# Patient Record
Sex: Male | Born: 1935
Health system: Southern US, Community
[De-identification: ages and names within clinical notes are randomized; demographics above are authoritative.]

## PROBLEM LIST (undated history)

## (undated) DIAGNOSIS — E7800 Pure hypercholesterolemia, unspecified: Secondary | ICD-10-CM

## (undated) DIAGNOSIS — I4892 Unspecified atrial flutter: Secondary | ICD-10-CM

## (undated) DIAGNOSIS — C61 Malignant neoplasm of prostate: Secondary | ICD-10-CM

## (undated) DIAGNOSIS — S065X9A Traumatic subdural hemorrhage with loss of consciousness of unspecified duration, initial encounter: Secondary | ICD-10-CM

## (undated) DIAGNOSIS — R55 Syncope and collapse: Secondary | ICD-10-CM

## (undated) DIAGNOSIS — E119 Type 2 diabetes mellitus without complications: Secondary | ICD-10-CM

## (undated) DIAGNOSIS — N529 Male erectile dysfunction, unspecified: Secondary | ICD-10-CM

## (undated) DIAGNOSIS — N400 Enlarged prostate without lower urinary tract symptoms: Secondary | ICD-10-CM

## (undated) DIAGNOSIS — M5137 Other intervertebral disc degeneration, lumbosacral region: Secondary | ICD-10-CM

## (undated) DIAGNOSIS — M519 Unspecified thoracic, thoracolumbar and lumbosacral intervertebral disc disorder: Secondary | ICD-10-CM

## (undated) DIAGNOSIS — M1710 Unilateral primary osteoarthritis, unspecified knee: Secondary | ICD-10-CM

## (undated) DIAGNOSIS — I442 Atrioventricular block, complete: Secondary | ICD-10-CM

## (undated) DIAGNOSIS — M545 Low back pain, unspecified: Secondary | ICD-10-CM

## (undated) DIAGNOSIS — M179 Osteoarthritis of knee, unspecified: Secondary | ICD-10-CM

## (undated) DIAGNOSIS — E785 Hyperlipidemia, unspecified: Secondary | ICD-10-CM

## (undated) DIAGNOSIS — M1990 Unspecified osteoarthritis, unspecified site: Secondary | ICD-10-CM

## (undated) DIAGNOSIS — K625 Hemorrhage of anus and rectum: Secondary | ICD-10-CM

## (undated) DIAGNOSIS — M109 Gout, unspecified: Secondary | ICD-10-CM

## (undated) DIAGNOSIS — I2510 Atherosclerotic heart disease of native coronary artery without angina pectoris: Secondary | ICD-10-CM

## (undated) DIAGNOSIS — R001 Bradycardia, unspecified: Secondary | ICD-10-CM

## (undated) DIAGNOSIS — Z950 Presence of cardiac pacemaker: Secondary | ICD-10-CM

## (undated) DIAGNOSIS — I10 Essential (primary) hypertension: Secondary | ICD-10-CM

## (undated) DIAGNOSIS — Z87898 Personal history of other specified conditions: Secondary | ICD-10-CM

## (undated) DIAGNOSIS — K279 Peptic ulcer, site unspecified, unspecified as acute or chronic, without hemorrhage or perforation: Secondary | ICD-10-CM

## (undated) HISTORY — DX: Type 2 diabetes mellitus without complications: E11.9

## (undated) HISTORY — DX: Atrioventricular block, complete: I44.2

## (undated) HISTORY — DX: Unspecified atrial flutter: I48.92

## (undated) HISTORY — DX: Osteoarthritis of knee, unspecified: M17.9

## (undated) HISTORY — DX: Hemorrhage of anus and rectum: K62.5

## (undated) HISTORY — DX: Essential (primary) hypertension: I10

## (undated) HISTORY — DX: Low back pain: M54.5

## (undated) HISTORY — PX: INSERT / REPLACE / REMOVE PACEMAKER: SUR710

## (undated) HISTORY — DX: Malignant neoplasm of prostate: C61

## (undated) HISTORY — DX: Syncope and collapse: R55

## (undated) HISTORY — DX: Unspecified thoracic, thoracolumbar and lumbosacral intervertebral disc disorder: M51.9

## (undated) HISTORY — PX: HEMORRHOID SURGERY: SHX153

## (undated) HISTORY — PX: TRANSURETHRAL RESECTION OF PROSTATE: SHX73

## (undated) HISTORY — DX: Unspecified osteoarthritis, unspecified site: M19.90

## (undated) HISTORY — DX: Other intervertebral disc degeneration, lumbosacral region: M51.37

## (undated) HISTORY — DX: Traumatic subdural hemorrhage with loss of consciousness of unspecified duration, initial encounter: S06.5X9A

## (undated) HISTORY — DX: Hyperlipidemia, unspecified: E78.5

## (undated) HISTORY — DX: Personal history of other specified conditions: Z87.898

## (undated) HISTORY — DX: Peptic ulcer, site unspecified, unspecified as acute or chronic, without hemorrhage or perforation: K27.9

## (undated) HISTORY — DX: Unilateral primary osteoarthritis, unspecified knee: M17.10

## (undated) HISTORY — DX: Bradycardia, unspecified: R00.1

## (undated) HISTORY — DX: Gout, unspecified: M10.9

## (undated) HISTORY — DX: Male erectile dysfunction, unspecified: N52.9

## (undated) HISTORY — DX: Benign prostatic hyperplasia without lower urinary tract symptoms: N40.0

## (undated) HISTORY — DX: Pure hypercholesterolemia, unspecified: E78.00

## (undated) HISTORY — DX: Atherosclerotic heart disease of native coronary artery without angina pectoris: I25.10

## (undated) HISTORY — DX: Low back pain, unspecified: M54.50

---

## 1998-03-21 ENCOUNTER — Encounter: Payer: Self-pay | Admitting: Internal Medicine

## 1998-03-21 ENCOUNTER — Emergency Department (HOSPITAL_COMMUNITY): Admission: EM | Admit: 1998-03-21 | Discharge: 1998-03-21 | Payer: Self-pay | Admitting: Internal Medicine

## 2002-03-16 ENCOUNTER — Encounter: Payer: Self-pay | Admitting: Emergency Medicine

## 2002-03-16 ENCOUNTER — Emergency Department (HOSPITAL_COMMUNITY): Admission: EM | Admit: 2002-03-16 | Discharge: 2002-03-16 | Payer: Self-pay | Admitting: Emergency Medicine

## 2002-03-23 ENCOUNTER — Emergency Department (HOSPITAL_COMMUNITY): Admission: EM | Admit: 2002-03-23 | Discharge: 2002-03-23 | Payer: Self-pay | Admitting: Emergency Medicine

## 2007-05-26 ENCOUNTER — Encounter: Admission: RE | Admit: 2007-05-26 | Discharge: 2007-05-26 | Payer: Self-pay | Admitting: Obstetrics and Gynecology

## 2007-06-07 ENCOUNTER — Ambulatory Visit: Admission: RE | Admit: 2007-06-07 | Discharge: 2007-06-14 | Payer: Self-pay | Admitting: Radiation Oncology

## 2007-06-15 ENCOUNTER — Ambulatory Visit: Admission: RE | Admit: 2007-06-15 | Discharge: 2007-08-12 | Payer: Self-pay | Admitting: Radiation Oncology

## 2007-08-13 ENCOUNTER — Ambulatory Visit: Admission: RE | Admit: 2007-08-13 | Discharge: 2007-09-18 | Payer: Self-pay | Admitting: Radiation Oncology

## 2010-02-21 ENCOUNTER — Inpatient Hospital Stay (HOSPITAL_COMMUNITY): Admission: EM | Admit: 2010-02-21 | Discharge: 2010-02-23 | Payer: Self-pay | Admitting: Emergency Medicine

## 2010-02-21 ENCOUNTER — Ambulatory Visit: Payer: Self-pay | Admitting: Internal Medicine

## 2010-04-01 DIAGNOSIS — I10 Essential (primary) hypertension: Secondary | ICD-10-CM | POA: Insufficient documentation

## 2010-04-01 DIAGNOSIS — H409 Unspecified glaucoma: Secondary | ICD-10-CM | POA: Insufficient documentation

## 2010-04-01 DIAGNOSIS — Z87898 Personal history of other specified conditions: Secondary | ICD-10-CM

## 2010-04-01 DIAGNOSIS — M1990 Unspecified osteoarthritis, unspecified site: Secondary | ICD-10-CM | POA: Insufficient documentation

## 2010-04-01 DIAGNOSIS — M5137 Other intervertebral disc degeneration, lumbosacral region: Secondary | ICD-10-CM

## 2010-04-01 DIAGNOSIS — M51379 Other intervertebral disc degeneration, lumbosacral region without mention of lumbar back pain or lower extremity pain: Secondary | ICD-10-CM

## 2010-04-01 DIAGNOSIS — E1129 Type 2 diabetes mellitus with other diabetic kidney complication: Secondary | ICD-10-CM | POA: Insufficient documentation

## 2010-04-01 DIAGNOSIS — E785 Hyperlipidemia, unspecified: Secondary | ICD-10-CM | POA: Insufficient documentation

## 2010-04-01 DIAGNOSIS — N400 Enlarged prostate without lower urinary tract symptoms: Secondary | ICD-10-CM

## 2010-04-01 HISTORY — DX: Other intervertebral disc degeneration, lumbosacral region without mention of lumbar back pain or lower extremity pain: M51.379

## 2010-04-01 HISTORY — DX: Other intervertebral disc degeneration, lumbosacral region: M51.37

## 2010-04-01 HISTORY — DX: Personal history of other specified conditions: Z87.898

## 2010-04-01 HISTORY — DX: Unspecified osteoarthritis, unspecified site: M19.90

## 2010-08-27 LAB — CBC
HCT: 30.6 % — ABNORMAL LOW (ref 39.0–52.0)
HCT: 31.9 % — ABNORMAL LOW (ref 39.0–52.0)
Hemoglobin: 10.3 g/dL — ABNORMAL LOW (ref 13.0–17.0)
Hemoglobin: 10.7 g/dL — ABNORMAL LOW (ref 13.0–17.0)
MCH: 28.5 pg (ref 26.0–34.0)
MCHC: 33.5 g/dL (ref 30.0–36.0)
MCHC: 33.7 g/dL (ref 30.0–36.0)
RDW: 12.6 % (ref 11.5–15.5)
RDW: 12.8 % (ref 11.5–15.5)
WBC: 3.9 10*3/uL — ABNORMAL LOW (ref 4.0–10.5)

## 2010-08-27 LAB — CARDIAC PANEL(CRET KIN+CKTOT+MB+TROPI)
CK, MB: 2 ng/mL (ref 0.3–4.0)
CK, MB: 2 ng/mL (ref 0.3–4.0)
Relative Index: 1.3 (ref 0.0–2.5)
Relative Index: 1.3 (ref 0.0–2.5)
Troponin I: <0.01 ng/mL (ref 0.00–0.06)

## 2010-08-27 LAB — BASIC METABOLIC PANEL
Calcium: 8.8 mg/dL (ref 8.4–10.5)
Calcium: 8.9 mg/dL (ref 8.4–10.5)
GFR calc Af Amer: 47 mL/min — ABNORMAL LOW (ref >60)
GFR calc non Af Amer: 38 mL/min — ABNORMAL LOW (ref >60)
GFR calc non Af Amer: 40 mL/min — ABNORMAL LOW (ref >60)
Potassium: 4.5 mEq/L (ref 3.5–5.1)
Potassium: 4.6 mEq/L (ref 3.5–5.1)
Sodium: 138 mEq/L (ref 135–145)
Sodium: 139 mEq/L (ref 135–145)

## 2010-08-27 LAB — COMPREHENSIVE METABOLIC PANEL
ALT: 13 U/L (ref 0–53)
AST: 23 U/L (ref 0–37)
Albumin: 3.7 g/dL (ref 3.5–5.2)
Alkaline Phosphatase: 83 U/L (ref 39–117)
Chloride: 107 mEq/L (ref 96–112)
GFR calc Af Amer: 42 mL/min — ABNORMAL LOW (ref >60)
Potassium: 4.3 mEq/L (ref 3.5–5.1)
Sodium: 138 mEq/L (ref 135–145)
Total Bilirubin: 0.6 mg/dL (ref 0.3–1.2)
Total Protein: 7.1 g/dL (ref 6.0–8.3)

## 2010-08-27 LAB — RETICULOCYTES
RBC.: 3.83 MIL/uL — ABNORMAL LOW (ref 4.22–5.81)
Retic Count, Absolute: 30.6 10*3/uL (ref 19.0–186.0)
Retic Ct Pct: 0.8 % (ref 0.4–3.1)

## 2010-08-27 LAB — URINALYSIS, ROUTINE W REFLEX MICROSCOPIC
Nitrite: NEGATIVE
Specific Gravity, Urine: 1.013 (ref 1.005–1.030)
Urobilinogen, UA: 0.2 mg/dL (ref 0.0–1.0)
pH: 5 (ref 5.0–8.0)

## 2010-08-27 LAB — IRON AND TIBC
Iron: 59 ug/dL (ref 42–135)
Saturation Ratios: 24 % (ref 20–55)
TIBC: 241 ug/dL (ref 215–435)
UIBC: 182 ug/dL

## 2010-08-27 LAB — LIPID PANEL
Cholesterol: 132 mg/dL (ref 0–200)
LDL Cholesterol: 83 mg/dL (ref 0–99)
Total CHOL/HDL Ratio: 3.5 RATIO
Triglycerides: 56 mg/dL (ref <150)
VLDL: 11 mg/dL (ref 0–40)

## 2010-08-27 LAB — DIFFERENTIAL
Basophils Absolute: 0 10*3/uL (ref 0.0–0.1)
Basophils Relative: 1 % (ref 0–1)
Eosinophils Absolute: 0.1 10*3/uL (ref 0.0–0.7)
Monocytes Absolute: 0.3 10*3/uL (ref 0.1–1.0)
Monocytes Relative: 12 % (ref 3–12)
Neutro Abs: 1.5 10*3/uL — ABNORMAL LOW (ref 1.7–7.7)
Neutrophils Relative %: 52 % (ref 43–77)

## 2010-08-27 LAB — GLUCOSE, CAPILLARY
Glucose-Capillary: 107 mg/dL — ABNORMAL HIGH (ref 70–99)
Glucose-Capillary: 178 mg/dL — ABNORMAL HIGH (ref 70–99)
Glucose-Capillary: 196 mg/dL — ABNORMAL HIGH (ref 70–99)
Glucose-Capillary: 54 mg/dL — ABNORMAL LOW (ref 70–99)
Glucose-Capillary: 68 mg/dL — ABNORMAL LOW (ref 70–99)
Glucose-Capillary: 99 mg/dL (ref 70–99)
Glucose-Capillary: 99 mg/dL (ref 70–99)

## 2010-08-27 LAB — POCT I-STAT, CHEM 8
Calcium, Ion: 1.18 mmol/L (ref 1.12–1.32)
Chloride: 110 mEq/L (ref 96–112)
Creatinine, Ser: 1.9 mg/dL — ABNORMAL HIGH (ref 0.4–1.5)
Glucose, Bld: 86 mg/dL (ref 70–99)
Potassium: 4.6 mEq/L (ref 3.5–5.1)

## 2010-08-27 LAB — PROTIME-INR: Prothrombin Time: 14.3 seconds (ref 11.6–15.2)

## 2010-08-27 LAB — POCT CARDIAC MARKERS
CKMB, poc: 1.1 ng/mL (ref 1.0–8.0)
Troponin i, poc: <0.05 ng/mL (ref 0.00–0.09)

## 2010-08-27 LAB — PHOSPHORUS: Phosphorus: 3.3 mg/dL (ref 2.3–4.6)

## 2010-08-27 LAB — FERRITIN: Ferritin: 120 ng/mL (ref 22–322)

## 2010-08-27 LAB — TSH: TSH: 1.723 u[IU]/mL (ref 0.350–4.500)

## 2010-08-27 LAB — MRSA PCR SCREENING: MRSA by PCR: NEGATIVE

## 2010-08-27 LAB — CK TOTAL AND CKMB (NOT AT ARMC): Total CK: 173 U/L (ref 7–232)

## 2010-10-13 HISTORY — PX: PACEMAKER INSERTION: SHX728

## 2010-11-02 ENCOUNTER — Inpatient Hospital Stay (HOSPITAL_COMMUNITY)
Admission: EM | Admit: 2010-11-02 | Discharge: 2010-11-08 | DRG: 243 | Disposition: A | Discharge status: Home / Self Care | Payer: No Typology Code available for payment source | Source: Other Acute Inpatient Hospital | Attending: Cardiology | Admitting: Cardiology

## 2010-11-02 ENCOUNTER — Emergency Department (HOSPITAL_COMMUNITY)
Admission: EM | Admit: 2010-11-02 | Discharge: 2010-11-02 | Disposition: A | Discharge status: Other Acute Inpatient Hospital | Payer: No Typology Code available for payment source | Source: Home / Self Care | Attending: Emergency Medicine | Admitting: Emergency Medicine

## 2010-11-02 DIAGNOSIS — I4892 Unspecified atrial flutter: Secondary | ICD-10-CM | POA: Diagnosis present

## 2010-11-02 DIAGNOSIS — I495 Sick sinus syndrome: Principal | ICD-10-CM | POA: Diagnosis present

## 2010-11-02 DIAGNOSIS — D696 Thrombocytopenia, unspecified: Secondary | ICD-10-CM | POA: Diagnosis present

## 2010-11-02 DIAGNOSIS — M1710 Unilateral primary osteoarthritis, unspecified knee: Secondary | ICD-10-CM | POA: Diagnosis present

## 2010-11-02 DIAGNOSIS — E1169 Type 2 diabetes mellitus with other specified complication: Secondary | ICD-10-CM | POA: Insufficient documentation

## 2010-11-02 DIAGNOSIS — N179 Acute kidney failure, unspecified: Secondary | ICD-10-CM | POA: Diagnosis not present

## 2010-11-02 DIAGNOSIS — R29898 Other symptoms and signs involving the musculoskeletal system: Secondary | ICD-10-CM | POA: Diagnosis not present

## 2010-11-02 DIAGNOSIS — R4182 Altered mental status, unspecified: Secondary | ICD-10-CM | POA: Insufficient documentation

## 2010-11-02 DIAGNOSIS — Z79899 Other long term (current) drug therapy: Secondary | ICD-10-CM

## 2010-11-02 DIAGNOSIS — M5137 Other intervertebral disc degeneration, lumbosacral region: Secondary | ICD-10-CM | POA: Diagnosis present

## 2010-11-02 DIAGNOSIS — I498 Other specified cardiac arrhythmias: Secondary | ICD-10-CM | POA: Insufficient documentation

## 2010-11-02 DIAGNOSIS — M51379 Other intervertebral disc degeneration, lumbosacral region without mention of lumbar back pain or lower extremity pain: Secondary | ICD-10-CM | POA: Diagnosis present

## 2010-11-02 DIAGNOSIS — N400 Enlarged prostate without lower urinary tract symptoms: Secondary | ICD-10-CM | POA: Diagnosis present

## 2010-11-02 DIAGNOSIS — D649 Anemia, unspecified: Secondary | ICD-10-CM | POA: Diagnosis present

## 2010-11-02 DIAGNOSIS — I059 Rheumatic mitral valve disease, unspecified: Secondary | ICD-10-CM | POA: Diagnosis present

## 2010-11-02 DIAGNOSIS — E785 Hyperlipidemia, unspecified: Secondary | ICD-10-CM | POA: Diagnosis present

## 2010-11-02 DIAGNOSIS — I10 Essential (primary) hypertension: Secondary | ICD-10-CM | POA: Insufficient documentation

## 2010-11-02 DIAGNOSIS — R55 Syncope and collapse: Secondary | ICD-10-CM

## 2010-11-02 DIAGNOSIS — Z794 Long term (current) use of insulin: Secondary | ICD-10-CM

## 2010-11-02 LAB — CBC
HCT: 37 % — ABNORMAL LOW (ref 39.0–52.0)
Hemoglobin: 12 g/dL — ABNORMAL LOW (ref 13.0–17.0)
Hemoglobin: 11.5 g/dL — ABNORMAL LOW (ref 13.0–17.0)
MCH: 26.3 pg (ref 26.0–34.0)
MCHC: 32.4 g/dL (ref 30.0–36.0)
MCHC: 33.3 g/dL (ref 30.0–36.0)
RDW: 13.5 % (ref 11.5–15.5)

## 2010-11-02 LAB — DIFFERENTIAL
Basophils Relative: 0 % (ref 0–1)
Eosinophils Relative: 1 % (ref 0–5)
Monocytes Absolute: 0.4 10*3/uL (ref 0.1–1.0)
Monocytes Relative: 9 % (ref 3–12)
Neutro Abs: 3.3 10*3/uL (ref 1.7–7.7)

## 2010-11-02 LAB — URINALYSIS, ROUTINE W REFLEX MICROSCOPIC
Glucose, UA: NEGATIVE mg/dL
Leukocytes, UA: NEGATIVE
pH: 5.5 (ref 5.0–8.0)

## 2010-11-02 LAB — URINE MICROSCOPIC-ADD ON

## 2010-11-02 LAB — COMPREHENSIVE METABOLIC PANEL
Alkaline Phosphatase: 74 U/L (ref 39–117)
BUN: 20 mg/dL (ref 6–23)
CO2: 27 mEq/L (ref 19–32)
Chloride: 109 mEq/L (ref 96–112)
GFR calc non Af Amer: 55 mL/min — ABNORMAL LOW (ref >60)
Glucose, Bld: 91 mg/dL (ref 70–99)
Potassium: 4.3 mEq/L (ref 3.5–5.1)
Total Bilirubin: 0.3 mg/dL (ref 0.3–1.2)
Total Protein: 6.5 g/dL (ref 6.0–8.3)

## 2010-11-02 LAB — GLUCOSE, CAPILLARY
Glucose-Capillary: 116 mg/dL — ABNORMAL HIGH (ref 70–99)
Glucose-Capillary: 59 mg/dL — ABNORMAL LOW (ref 70–99)

## 2010-11-02 LAB — POCT CARDIAC MARKERS

## 2010-11-02 LAB — PROTIME-INR
INR: 1.15 (ref 0.00–1.49)
Prothrombin Time: 14.9 seconds (ref 11.6–15.2)

## 2010-11-02 LAB — CK TOTAL AND CKMB (NOT AT ARMC): Relative Index: 2 (ref 0.0–2.5)

## 2010-11-03 DIAGNOSIS — I495 Sick sinus syndrome: Secondary | ICD-10-CM

## 2010-11-03 DIAGNOSIS — I059 Rheumatic mitral valve disease, unspecified: Secondary | ICD-10-CM

## 2010-11-03 DIAGNOSIS — R55 Syncope and collapse: Secondary | ICD-10-CM

## 2010-11-03 DIAGNOSIS — I498 Other specified cardiac arrhythmias: Secondary | ICD-10-CM

## 2010-11-03 LAB — CBC
HCT: 31.9 % — ABNORMAL LOW (ref 39.0–52.0)
MCH: 26.5 pg (ref 26.0–34.0)
MCHC: 33.5 g/dL (ref 30.0–36.0)
MCV: 79 fL (ref 78.0–100.0)
RDW: 13.5 % (ref 11.5–15.5)

## 2010-11-03 LAB — POCT I-STAT, CHEM 8
Calcium, Ion: 0.97 mmol/L — ABNORMAL LOW (ref 1.12–1.32)
Calcium, Ion: 0.97 mmol/L — ABNORMAL LOW (ref 1.12–1.32)
Creatinine, Ser: 1.6 mg/dL — ABNORMAL HIGH (ref 0.4–1.5)
Creatinine, Ser: 1.7 mg/dL — ABNORMAL HIGH (ref 0.4–1.5)
Glucose, Bld: 94 mg/dL (ref 70–99)
Glucose, Bld: 96 mg/dL (ref 70–99)
HCT: 37 % — ABNORMAL LOW (ref 39.0–52.0)
Hemoglobin: 12.6 g/dL — ABNORMAL LOW (ref 13.0–17.0)
Hemoglobin: 12.9 g/dL — ABNORMAL LOW (ref 13.0–17.0)
Potassium: 8.9 mEq/L (ref 3.5–5.1)
Potassium: 8.9 mEq/L (ref 3.5–5.1)
TCO2: 20 mmol/L (ref 0–100)

## 2010-11-03 LAB — GLUCOSE, CAPILLARY
Glucose-Capillary: 82 mg/dL (ref 70–99)
Glucose-Capillary: 167 mg/dL — ABNORMAL HIGH (ref 70–99)
Glucose-Capillary: 115 mg/dL — ABNORMAL HIGH (ref 70–99)
Glucose-Capillary: 147 mg/dL — ABNORMAL HIGH (ref 70–99)

## 2010-11-03 LAB — HEMOGLOBIN A1C
Hgb A1c MFr Bld: 7.1 % — ABNORMAL HIGH (ref <5.7)
Mean Plasma Glucose: 157 mg/dL — ABNORMAL HIGH (ref <117)

## 2010-11-03 LAB — MRSA PCR SCREENING: MRSA by PCR: NEGATIVE

## 2010-11-03 LAB — CK TOTAL AND CKMB (NOT AT ARMC)
CK, MB: 2.3 ng/mL (ref 0.3–4.0)
Relative Index: 1.9 (ref 0.0–2.5)
Total CK: 123 U/L (ref 7–232)

## 2010-11-03 LAB — BASIC METABOLIC PANEL
BUN: 19 mg/dL (ref 6–23)
CO2: 23 mEq/L (ref 19–32)
Chloride: 109 mEq/L (ref 96–112)
Creatinine, Ser: 1.37 mg/dL (ref 0.4–1.5)
Potassium: 3.9 mEq/L (ref 3.5–5.1)

## 2010-11-03 LAB — TSH: TSH: 3.219 u[IU]/mL (ref 0.350–4.500)

## 2010-11-04 ENCOUNTER — Inpatient Hospital Stay (HOSPITAL_COMMUNITY): Payer: No Typology Code available for payment source

## 2010-11-04 DIAGNOSIS — I10 Essential (primary) hypertension: Secondary | ICD-10-CM

## 2010-11-04 LAB — GLUCOSE, CAPILLARY
Glucose-Capillary: 183 mg/dL — ABNORMAL HIGH (ref 70–99)
Glucose-Capillary: 137 mg/dL — ABNORMAL HIGH (ref 70–99)

## 2010-11-04 LAB — BASIC METABOLIC PANEL
CO2: 23 mEq/L (ref 19–32)
Glucose, Bld: 139 mg/dL — ABNORMAL HIGH (ref 70–99)
Potassium: 5.1 mEq/L (ref 3.5–5.1)
Sodium: 138 mEq/L (ref 135–145)

## 2010-11-04 LAB — CBC
Hemoglobin: 12.2 g/dL — ABNORMAL LOW (ref 13.0–17.0)
MCH: 26.6 pg (ref 26.0–34.0)
MCV: 79.7 fL (ref 78.0–100.0)
Platelets: 127 10*3/uL — ABNORMAL LOW (ref 150–400)
RBC: 4.59 MIL/uL (ref 4.22–5.81)

## 2010-11-05 ENCOUNTER — Inpatient Hospital Stay (HOSPITAL_COMMUNITY): Payer: No Typology Code available for payment source

## 2010-11-05 LAB — CBC
HCT: 36.8 % — ABNORMAL LOW (ref 39.0–52.0)
MCHC: 33.7 g/dL (ref 30.0–36.0)
MCV: 79.8 fL (ref 78.0–100.0)
RDW: 13.5 % (ref 11.5–15.5)

## 2010-11-05 LAB — GLUCOSE, CAPILLARY: Glucose-Capillary: 148 mg/dL — ABNORMAL HIGH (ref 70–99)

## 2010-11-05 NOTE — Consult Note (Signed)
NAME:  Mario Warner, Mario Warner NO.:  [id number]  MEDICAL RECORD NO.:  [id number]           PATIENT TYPE:  I  LOCATION:  6529                         FACILITY:  [facility]  PHYSICIAN:  Thana Farr, MD    DATE OF BIRTH:  [birth year]  DATE OF CONSULTATION: DATE OF DISCHARGE:                                CONSULTATION   This is a Neurology consult for code stroke.  CHIEF CONCERN:  Left-sided weakness with right-sided gaze and speech disturbance.  HISTORY OF PRESENT ILLNESS:  The patient is a 77 year old male, highly functional at baseline, with history of tachy-brady syndrome, status post pacemaker placement, postop day #2, who was in his usual state of health and has suddenly felt weakness on left side this morning with some speech disturbance.  The patient denies similar events in the past. He reports feeling fine at the moment and reports that his speech appears to be at his baseline. Code stroke was called secondary to speech deficits.  NIHSS on evaluation was 0.  PAST MEDICAL HISTORY: 1. Tachy-brady syndrome. 2. Status post pacemaker placement, Nov 03, 2010. 3. Hypertension. 4. Hyperlipidemia. 5. Diabetes. 6. BPH. 7. Stroke risk factors. 8. Diabetes. 9. Hypertension. 10.Atrial flutter.  MEDICATIONS IN THE HOSPITAL: 1. Amlodipine 5 mg once daily. 2. Coreg 12.5 mg twice daily. 3. Cozaar 100 mg once daily. 4. Aspirin 81 mg once daily. 5. Cefazolin IV. 6. Crestor. 7. Zocor. 8. Coumadin. 9. Sliding scale insulin.  ALLERGIES:  NKDA.  FAMILY HISTORY:  Noncontributory.  SOCIAL HISTORY:  The patient lives at home, highly functional at baseline.  Bowing is his primary hobby and daily walking.  Denies alcohol, drug, or tobacco use.  REVIEW OF SYSTEMS:  Left-sided weakness with slurred speech, otherwise per HPI.  PHYSICAL EXAMINATION:  VITAL SIGNS:  Temperature 98.3, blood pressure 123/68, heart rate 70, respirations 15, and oxygen saturation 97% on room  air. HEENT:  Head atraumatic. NECK:  Supple.  Full range of motion. LUNGS:  Clear to auscultation bilaterally with no wheezing. CARDIOVASCULAR:  Regular rhythm.  No murmurs appreciated. ABDOMEN:  Soft, nontender, and nondistended.  Bowel sounds present. SKIN:  Skin and mucosa are moist with good turgor. NEUROLOGIC:  Mental status, the patient is awake and alert to his name, time, place, and date of brith.  Speech is clear and fluent.  No dysarthria or aphasia noted.  The patient follows commands and is cooperative to examination.  Cranial nerves; II - visual fields full, disks flat bilaterally.  III, IV, VI - EOMI.  V, VII; sensation intact in facial area, face is symmetric, no facial droop.  VIII, hearing intact.  IX, X, XI, XII; gag present, shoulder shrug intact bilaterally, tongue and uvula midline.  Motor strength is 5/5 in the lower and upper extremities bilaterally.  Sensation - intact throughout.  Cerebellar - intact heel-to-shin and finger-to-nose.  DTR +2 throughout.  Plantars mute.  TEST RESULTS:  Sodium 138, potassium 5.1, chloride 107, bicarb 23, BUN 20, creatinine 1.55, and glucose 139.  WBC 4.5, hemoglobin 12.4, hematocrit 36.8, and platelets 125.  PT 17.5 and INR 1.41.  Hemoglobin A1c 7.1.  TSH 3.219.  Chest x-ray status post pacemaker placement, no effusions or pneumothorax or other cardiopulmonary abnormalities noted. 2-D echo Nov 03, 2010, ejection fraction 55%, mild mitral regurgitation, no PFO.  CT of the head Nov 05, 2010, preliminary reading, no acute intracranial abnormalities noted.  ASSESSMENT:  The patient is a 77 year old male with tachy-brady syndrome, status post pacemaker placement, postop day #2, presents with sudden onset of left-sided weakness and slurred speech.  Differential includes transient ischemic attack, cerebrovascular accident and hypoperfusion secondary to decrease in blood pressure (on admission, blood pressure was 200/60 and today,  123/68).  Preliminary CT of the head is not suggestive of acute intracranial abnormalities. Physical exam findings are within normal limits with no gross neurologic deficits noted.  The patient is not a tPA candidate since his symptoms are resolved at this time.  PLAN: 1. Check orthostatic vitals. 2. Liberalize blood pressure with more gradual achievement of target blood pressure. 3. No further neurologic workup recommended. 4. Treatment and current plan is discussed with the patient.  60 minutes was spent on the consultation.          ______________________________ Thana Farr, MD     LR/MEDQ  D:  11/05/2010  T:  11/05/2010  Job:  [id number]  Electronically Signed by Thana Farr MD on 11/05/2010 05:17:45 PM

## 2010-11-06 DIAGNOSIS — N289 Disorder of kidney and ureter, unspecified: Secondary | ICD-10-CM

## 2010-11-06 LAB — BASIC METABOLIC PANEL
BUN: 35 mg/dL — ABNORMAL HIGH (ref 6–23)
Calcium: 9.3 mg/dL (ref 8.4–10.5)
GFR calc non Af Amer: 33 mL/min — ABNORMAL LOW (ref >60)
Potassium: 4.8 mEq/L (ref 3.5–5.1)

## 2010-11-06 LAB — GLUCOSE, CAPILLARY
Glucose-Capillary: 135 mg/dL — ABNORMAL HIGH (ref 70–99)
Glucose-Capillary: 126 mg/dL — ABNORMAL HIGH (ref 70–99)
Glucose-Capillary: 160 mg/dL — ABNORMAL HIGH (ref 70–99)

## 2010-11-06 LAB — CBC
HCT: 35.4 % — ABNORMAL LOW (ref 39.0–52.0)
Hemoglobin: 11.9 g/dL — ABNORMAL LOW (ref 13.0–17.0)
RDW: 13.6 % (ref 11.5–15.5)
WBC: 4.4 10*3/uL (ref 4.0–10.5)

## 2010-11-06 LAB — PROTIME-INR
INR: 1.69 — ABNORMAL HIGH (ref 0.00–1.49)
Prothrombin Time: 20.1 seconds — ABNORMAL HIGH (ref 11.6–15.2)

## 2010-11-06 NOTE — Consult Note (Signed)
NAME:  VAIBHAV, FOGLEMAN NO.:  [id number]  MEDICAL RECORD NO.:  [id number]           PATIENT TYPE:  LOCATION:                                 FACILITY:  PHYSICIAN:  Hillis Range, MD       DATE OF BIRTH:  1916/09/13  DATE OF CONSULTATION: DATE OF DISCHARGE:                                CONSULTATION   REQUESTING PHYSICIAN:  Rollene Rotunda, MD, [facility].  REASON FOR CONSULTATION:  Bradycardia.  HISTORY OF PRESENT ILLNESS:  Mr. Whitney Bingaman is a pleasant 77 year old gentleman, well known to me from his prior hospitalization in September 2011 with bradycardia, who now presents with recurrent bradycardia.  The patient is a very active 77 year old with a history of hypertension and diabetes.  He presented to [facility], May 21, with symptoms of presyncope.  He apparently was speaking with a friend when he suddenly felt lightheadedness and fatigue.  He then felt as if he might pass out. It is not clear as to whether or not the patient had full loss of consciousness.  Friends felt that he was apparently slightly confused. He was brought to [facility] where he was found to have atrial flutter with ventricular rates in the 30s and 40s.  He was also noted to be hypoglycemic with a blood sugar of 40.  In addition, he was quite hypertensive with a blood pressure of 231/90.  Presently, he is resting comfortably and is without complaint.  He reports occasional episodes of fatigue and dizziness, but denies chest pain, palpitations, or other episodes of syncope.  He was evaluated by me in September 2011, at which time, he was also found to have sinus node dysfunction with heart rates frequently in the 30s to 50s.  At that time, digoxin and timolol was discontinued and he has slight improvement in heart rates.  He was discharged and had previously done well with outpatient surveillance.  PAST MEDICAL HISTORY: 1. Diabetes. 2. Hypertension. 3.  Hyperlipidemia. 4. Glaucoma. 5. Benign prostatic hypertrophy. 6. Sinus node dysfunction. 7. Degenerative disk disease.  PAST SURGICAL HISTORY: 1. Hemorrhoidectomy. 2. TURP.  ALLERGIES:  No known drug allergies.  MEDICATIONS:  Reviewed in the [facility].  He is on no AV nodal blocking agents.  SOCIAL HISTORY:  The patient is retired and lives in [place].  He lives alone.  He has a remote history of tobacco and denies alcohol or drug use.  FAMILY HISTORY:  The patient is unaware of any significant past history.  REVIEW OF SYSTEMS:  All systems are reviewed and negative except as outlined in the HPI above.  PHYSICAL EXAM:  TELEMETRY:  Reveals atrial flutter with ventricular rates predominantly in the 30s.  He does have occasional heart rates in the 50s. VITAL SIGNS:  Blood pressure 141/77, heart rate 46, respirations 16, sats 98% on room air, afebrile. GENERAL:  The patient is a well-appearing elderly male, in no acute distress.  He is alert and oriented x3. HEENT:  Normocephalic, atraumatic.  Sclerae clear.  Conjunctivae pink. Oropharynx clear. NECK:  Supple.  No thyromegaly, JVD, or bruits. LUNGS:  Clear to  auscultation bilaterally. HEART:  Irregularly bradycardic rhythm.  No murmurs, rubs, or gallops. GI:  Soft, nontender, nondistended.  Positive bowel sounds. EXTREMITIES:  No clubbing, cyanosis, or edema. SKIN:  No ecchymoses or lacerations. MUSCULOSKELETAL:  No deformity or atrophy. PSYCH:  Euthymic mood.  Full affect.  EKG:  Reveals typical appearing atrial flutter with ventricular rates of 35 beats per minute.  Poor R-wave progression as well as a left anterior hemiblock are observed.  ECHOCARDIOGRAM:  From May 22 reveals an ejection fraction of 55% with mild mitral regurgitation and moderate LVH.  No significant valvular abnormalities are appreciated.  LABS:  Potassium 3.9, creatinine 1.3, glucose 94, INR 1.1.  Hematocrit 31, platelets 123.  IMPRESSION:  Mr.  Lipford is a very pleasant 77 year old gentleman with a known history of sinus node dysfunction, who now presents with symptomatic bradycardia and presyncope in the setting of atrial flutter. He is on no AV nodal blocking agents presently.  I therefore think that the most prudent strategy for Mr. Trumbull is to now proceed with pacemaker implantation.  Given atrial flutter and multiple stroke risk factors, I think that he should be initiated on Coumadin thereafter.  I would avoid catheter ablation given his advanced age at this time.  He appears to be relatively asymptomatic with atrial flutter, though, he is clearly symptomatic with his bradycardia.  His blood pressure is quite elevated and we will follow this during his hospital stay.  PLAN:  Risks, benefits, and alternatives to pacemaker implantation were discussed at length with Mr. Helming.  He understands that the risk include but are not limited to bleeding, infection, pneumothorax, vascular damage, perforation, tamponade, lead dislodgement, renal failure, stroke, MI, and death.  He accepts these risks and wishes to proceed.  We will therefore plan for pacemaker implantation at the next available time.     Hillis Range, MD     JA/MEDQ  D:  11/03/2010  T:  11/04/2010  Job:  [id number]  cc:   Rollene Rotunda, MD, [facility] Gaspar Garbe, M.D.  Electronically Signed by Hillis Range MD on 11/06/2010 05:56:30 PM

## 2010-11-06 NOTE — Op Note (Signed)
NAME:  Mario Warner, Mario Warner NO.:  [id number]  MEDICAL RECORD NO.:  [id number]           PATIENT TYPE:  I  LOCATION:  6529                         FACILITY:  [facility]  PHYSICIAN:  Hillis Range, MD       DATE OF BIRTH:  [birth year]  DATE OF PROCEDURE: DATE OF DISCHARGE:                              OPERATIVE REPORT   PREPROCEDURE DIAGNOSES: 1. Tachycardia bradycardia syndrome with symptomatic bradycardia. 2. Atrial flutter.  POSTPROCEDURE DIAGNOSES: 1. Tachycardia bradycardia syndrome with symptomatic bradycardia. 2. Atrial flutter.  PROCEDURES: 1. Pacemaker implantation. 2. Left upper extremity venography.  INTRODUCTION:  Mario Warner is a very pleasant 77 year old gentleman with a known history of prior sinus node dysfunction who now presents with tachycardia bradycardia syndrome.  He presented with symptoms of dizziness and presyncope and was found to have atrial flutter with ventricular rates in the 30s.  He has also been observed to have brief episodes of tachycardia.  Given symptomatic bradycardia previously and now, recurrent off of AV nodal blocking agent, this is felt most prudent to proceed with pacemaker implantation.  DESCRIPTION OF PROCEDURE:  Informed written consent was obtained and the patient was brought to the electrophysiology lab in the fasting state. He required no intravenous sedation for the procedure today.  His left chest was prepped and draped in the usual sterile fashion by the EP lab staff.  The skin overlying the left deltopectoral region was infiltrated with lidocaine for local analgesia.  A left subcutaneous pacemaker pocket was fashioned using a combination of sharp and blunt dissection. Electrocautery was required to assure hemostasis.  A venogram of the left upper extremity was performed which revealed a moderate-sized left axillary vein which was emptied into a moderate-sized left subclavian vein.  The left axillary vein was  therefore cannulated with fluoroscopic visualization.  Through the left axillary vein, a [employer] tendril model [id number] (serial number [id number]) right atrial lead and a [employer] Medical model [id number] (serial [id number]) right ventricular lead were advanced with fluoroscopic visualization into the right atrial appendage and right ventricular apex positions respectively.  Initial atrial flutter waves measured 1.5 mV with an impedance of 4.5 ohms and a threshold which could not be determined due to atrial flutter.  At the patient has not previously been anticoagulated, he was not cardioverted today.  Right ventricular lead R-waves measured 10.7 mV with an impedance of 650 ohms and a threshold of 0.5 volts with 0.5 milliseconds.  Both leads were secured to the pectoralis fascia using #2 silk suture over the suture sleeves.  The leads were then connected to a [employer] Medical Accent DRRF, model [id number] (serial [id number]) pacemaker. The pocket was irrigated with copious gentamicin solution.  The pacemaker was then placed into the pocket.  The pocket was then closed in two layers with 2.0 Vicryl suture rather subcutaneous and subcuticular layers.  Steri-Strips and a sterile dressing were then applied.  There were no early apparent complications.  CONCLUSIONS: 1. Successful implantation of a [employer]. Jude Medical Accent RFDR pacemaker     for tachycardia bradycardia syndrome and symptomatic bradycardia. 2. No early apparent complications.  Hillis Range, MD     JA/MEDQ  D:  11/03/2010  T:  11/04/2010  Job:  [id number]  cc:   Gaspar Garbe, M.D.  Electronically Signed by Hillis Range MD on 11/06/2010 05:56:25 PM

## 2010-11-07 DIAGNOSIS — I4892 Unspecified atrial flutter: Secondary | ICD-10-CM

## 2010-11-07 LAB — BASIC METABOLIC PANEL
BUN: 34 mg/dL — ABNORMAL HIGH (ref 6–23)
Chloride: 107 mEq/L (ref 96–112)
Creatinine, Ser: 1.53 mg/dL — ABNORMAL HIGH (ref 0.4–1.5)
Glucose, Bld: 129 mg/dL — ABNORMAL HIGH (ref 70–99)

## 2010-11-07 LAB — PROTIME-INR
INR: 2.17 — ABNORMAL HIGH (ref 0.00–1.49)
Prothrombin Time: 24.3 seconds — ABNORMAL HIGH (ref 11.6–15.2)

## 2010-11-07 LAB — CBC
HCT: 34.2 % — ABNORMAL LOW (ref 39.0–52.0)
MCHC: 33 g/dL (ref 30.0–36.0)
RDW: 13.5 % (ref 11.5–15.5)

## 2010-11-07 LAB — GLUCOSE, CAPILLARY: Glucose-Capillary: 144 mg/dL — ABNORMAL HIGH (ref 70–99)

## 2010-11-07 NOTE — Discharge Summary (Addendum)
NAME:  BOBBYE, REINITZ NO.:  [id number]  MEDICAL RECORD NO.:  [id number]           PATIENT TYPE:  I  LOCATION:  3743                         FACILITY:  [facility]  PHYSICIAN:  Cassell Clement, M.D. DATE OF BIRTH:  07-20-1916  DATE OF ADMISSION:  11/02/2010 DATE OF DISCHARGE:                              DISCHARGE SUMMARY   DISCHARGE DIAGNOSES: 1. Tachycardia-bradycardia syndrome with symptomatic bradycardia     status post St. Jude pacemaker implantation on Nov 03, 2010. 2. Atrial flutter, initiated on Coumadin this admission. 3. Altered mental status possibly related to hypoglycemia and     bradycardic rhythm. 4. Diabetes with hypoglycemia this admission.     a.     Diabetic medications on hold until seen by primary care      provider. 5. Ejection fraction of 55% by echocardiogram on Nov 03, 2010, with     mild mitral regurgitation. 6. Hypertension. 7. Dyslipidemia. 8. Benign prostatic hypertrophy.9. Lumbar disk disease. 10.Anemia and thrombocytopenia. 11.Status post hemorrhoidectomy. 12.Status post transurethral resection of prostate.  HOSPITAL COURSE:  Mr. Stfort is a pleasant 78 year old gentleman with a history of bradycardia, diabetes, hypertension, and hyperlipidemia, who presented to [facility] with symptoms of presyncope.  He suddenly felt weak and fatigued.  There was possibly a loss of consciousness but no witnesses to this macabre.  He was told he was not acting correctly and was brought to the ER, initially at [facility] via EMS.  He was found to be in A-flutter with a slow ventricular rate, narrow-complex gait.  He is hypertensive with blood pressure 231/90.  He has also been hypoglycemic with blood sugars in the 40s.  There has been no chest pain, neck or arm discomfort, or short of breath.  He has altered mental status possibly related to his hypoglycemia plus or minus the bradycardic rhythm.  Pacemaker was felt to be indicated and  he was seen in consultation by Electrophysiology within our group.  His diabetes medications were held and he was treated for his hypoglycemia. He was noted to be hypertensive and was initiated on Norvasc this admission.  Initially, hydrochlorothiazide component of his lisinopril was held.  He underwent pacemaker implantation on Nov 03, 2010, and did very well with no apparent complications.  Dr. Patty Sermons has seen and examined him today and feels he is stable for discharge.  In discussing Mr. Corea's discharge medications, he was noted to have an acute rise in his creatinine to 1.55 yesterday as well as a potassium of 5.1.  Dr. Patty Sermons would like to restart to the hydrochlorothiazide component of his lisinopril/hydrochlorothiazide in accordance with his home medications with a BMET closely followed up next week.  In regards to his hypoglycemia this admission, his blood sugars ranged from the 50s to the 180s, and Dr. Patty Sermons would like him to discontinue his diabetic medications until evaluated by his primary care provider.  DISCHARGE LABORATORY DATA:  WBC 4.5, hemoglobin 12.4, hematocrit 36.8, platelet count 125.  Sodium 138, potassium 5.1, chloride 107, CO2 23, glucose 139, BUN 20, creatinine 1.55.  hemoglobin A1c 7.1.  STUDIES: 1. Chest x-ray on  Nov 04, 2010, showed status post permanent pacemaker     implantation without evidence for pneumothorax or pleural effusion. 2. Operative report for pacemaker implantation on Nov 03, 2010, please     see full report for details. 3. A 2-D echocardiogram on Nov 03, 2010, demonstrated a band in the     mid LV cavity.  Distal septal hypokinesis.  Wall thickness was     increased and moderate LVH.  EF 55%.  Mild MR.  DISCHARGE MEDICATIONS: 1. Amlodipine 5 mg daily, 2. Coumadin 7.5 mg daily until he has an INR checked on Nov 10, 2010.     This was the recommended dose per pharmacy at [facility]. 3. Coreg 12.5 mg b.i.d. 4. Aleve  220 mg with instructions to only take sparingly since this     medication can increase the risk of sudden bleeding in patients     taking Coumadin.  He was instructed to try Tylenol first instead. 5. Lipitor 20 mg daily. 6. Losartan/hydrochlorothiazide 100/25 mg daily. 7. Lumigan 1 drop both eyes daily at bedtime. 8. Timolol 0.5% one drop both eyes daily. 9. Trusopt 1 drop both eyes t.i.d.  As above, his diabetic medications have been discontinued secondary to his hypoglycemia in anticipation to follow up with PCP.  His digoxin was also discontinued this admission.  DISPOSITION:  Mr. Heslin will be discharged in stable condition to home. He is to increase activity slowly and was given a supplemental discharge instruction sheet regarding specific directions regarding wound care, activity, and bathing.  He is not to go bowling for 6 weeks.  He will follow up in [facility] on November 18, 2010, at 3:30 p.m., and will follow up with Dr. Johney Frame on February 05, 2011, at 12 p.m.  He will have first Coumadin clinic appointment on Nov 10, 2010, at 1:45 p.m. as our office is closed on Memorial Day.  He will also get a BMET at this visit given his creatinine slight increase to 1.55 this admission.  He was also instructed to follow up with his PCP within the next few days to discuss his diabetic medications given hyperglycemia while here in the hospital.  DURATION OF DISCHARGE ENCOUNTER:  Greater than 30 minutes including physician and PA time.     Ronie Spies, P.A.C.   ______________________________ Cassell Clement, M.D.   DD/MEDQ  D:  11/05/2010  T:  11/05/2010  Job:  [id number]  cc:   Hillis Range, MD Gaspar Garbe, M.D.  Electronically Signed by Cassell Clement M.D. on 11/07/2010 03:10:52 PM Electronically Signed by Ronie Spies  on 11/13/2010 01:44:47 PM

## 2010-11-08 LAB — BASIC METABOLIC PANEL
CO2: 23 mEq/L (ref 19–32)
Chloride: 105 mEq/L (ref 96–112)
GFR calc Af Amer: 50 mL/min — ABNORMAL LOW (ref >60)
Potassium: 4.3 mEq/L (ref 3.5–5.1)
Sodium: 137 mEq/L (ref 135–145)

## 2010-11-08 LAB — GLUCOSE, CAPILLARY: Glucose-Capillary: 204 mg/dL — ABNORMAL HIGH (ref 70–99)

## 2010-11-08 LAB — CBC
HCT: 33.1 % — ABNORMAL LOW (ref 39.0–52.0)
Hemoglobin: 11 g/dL — ABNORMAL LOW (ref 13.0–17.0)
MCV: 79.8 fL (ref 78.0–100.0)
RBC: 4.15 MIL/uL — ABNORMAL LOW (ref 4.22–5.81)
RDW: 13.3 % (ref 11.5–15.5)
WBC: 3.9 10*3/uL — ABNORMAL LOW (ref 4.0–10.5)

## 2010-11-10 ENCOUNTER — Encounter: Payer: Self-pay | Admitting: Internal Medicine

## 2010-11-10 ENCOUNTER — Ambulatory Visit (INDEPENDENT_AMBULATORY_CARE_PROVIDER_SITE_OTHER): Payer: No Typology Code available for payment source | Admitting: *Deleted

## 2010-11-10 ENCOUNTER — Other Ambulatory Visit (INDEPENDENT_AMBULATORY_CARE_PROVIDER_SITE_OTHER): Payer: No Typology Code available for payment source | Admitting: *Deleted

## 2010-11-10 DIAGNOSIS — I4892 Unspecified atrial flutter: Secondary | ICD-10-CM

## 2010-11-10 DIAGNOSIS — N179 Acute kidney failure, unspecified: Secondary | ICD-10-CM

## 2010-11-10 LAB — BASIC METABOLIC PANEL
Chloride: 109 mEq/L (ref 96–112)
GFR: 40.38 mL/min — ABNORMAL LOW (ref >60.00)
Potassium: 4.4 mEq/L (ref 3.5–5.1)
Sodium: 141 mEq/L (ref 135–145)

## 2010-11-10 LAB — POCT INR: INR: 2.2

## 2010-11-13 NOTE — Discharge Summary (Signed)
NAME:  Mario Warner, JAKE NO.:  [id number]  MEDICAL RECORD NO.:  [id number]           PATIENT TYPE:  I  LOCATION:  3743                         FACILITY:  [facility]  PHYSICIAN:  Madolyn Frieze. Jens Som, MD, FACCDATE OF BIRTH:  01-18-1917  DATE OF ADMISSION:  11/02/2010 DATE OF DISCHARGE:                              DISCHARGE SUMMARY   ADDENDUM  PRIMARY CARDIOLOGIST:  Cassell Clement, MD  PRIMARY ELECTROPHYSIOLOGIST:  Hillis Range, MD.  PRIMARY CARE PHYSICIAN:  Gaspar Garbe, MD  CONSULTING PHYSICIAN:  [facility], in the setting of questionable CVA.  CONTINUATION OF HOSPITAL COURSE:  The patient had a sudden onset of altered mental status, weakness and a concern for acute CVA on Nov 05, 2010.  The patient was able to speak short words and knows name, but not date and time, noted right-sided gaze.  The patient was mildly diaphoretic with left-sided weakness.  The patient was seen by [facility] on Nov 05, 2010, and found to have no suggestive evidence of intracranial abnormalities.  The physical exam findings were within normal limits with no gross neurologic defects noted.  The patient was checked orthostatic vital signs and found to be positive. There was a decreased blood pressure from 153/77-115/64.  The patient's current medications were discontinued to include ARB and his diuretic.  The patient was monitored a couple more days to evaluate blood pressure status and found to respond best with less medication. The patient's blood pressure remained stable.  On Nov 07, 2010, the patient's blood pressure was moderately elevated at 153/77.  The patient's Coreg was resumed and amlodipine was added.  The Cozaar was not restarted.  The patient tolerated the medication reinstatement without any difficulties.  The patient was seen and examined by Dr. Olga Millers on day of discharge and found to be stable.  Blood pressure at  that time was 153/72.  He will continue the medications as he is taking here in the hospital.  He will continue his Coumadin at 5 mg alternating with 2.5 mg.  The patient will follow up in the Pacemaker Clinic in 1 week for wound check.  Follow up with Dr. Johney Frame in 4 weeks and continue with Dr. Patty Sermons for continued cardiac management.  The patient has been given postpacemaker instructions.  DISCHARGE LABS:  Dated Nov 08, 2010, sodium 137, potassium 4.3, chloride 105, CO2 23, glucose 147, BUN 35, creatinine 1.57, PT 26.4, INR 2.4. Hemoglobin 11.1, hematocrit 33.1, white blood cells 3.9, platelets 132.  RADIOLOGY:  Chest x-ray Nov 04, 2010, revealing status post permanent pacemaker placement without evidence for pneumothorax or pleural effusion.  CT head of the head Nov 05, 2010 revealing no acute intracranial abnormality, critical test results were phoned to Dr. Thad Ranger.  Atrophy and chronic microvascular white matter ischemic changes were noted.  VITAL SIGNS ON DISCHARGE:  Blood pressure 153/72, pulse 61, respirations 18, temperature 98 with an O2 sat of 98%.  Most recent weight take in Nov 02, 2010, revealed a weight of 80.7 kg.  DISCHARGE MEDICATIONS: 1. Coumadin as directed by PT/INR. 2. Crestor 10 mg daily. 3. Carvedilol  12.5 mg b.i.d. 4. Amlodipine 5 mg daily. 5. Acetaminophen 325 mg q.4 h p.r.n.  ALLERGIES:  No known drug allergies.  FOLLOWUP PLANS AND APPOINTMENT:  As described on prior discharge summary, please see that for more details.     Mario Warner. Mario Bishop, NP   ______________________________ Madolyn Frieze. Jens Som, MD, [facility]    KML/MEDQ  D:  11/08/2010  T:  11/08/2010  Job:  [id number]  Electronically Signed by Joni Reining NP on 11/09/2010 09:10:01 PM Electronically Signed by Olga Millers MD [facility] on 11/13/2010 04:54:28 PM

## 2010-11-16 NOTE — Progress Notes (Signed)
This encounter was created in error - please disregard.

## 2010-11-17 ENCOUNTER — Ambulatory Visit (INDEPENDENT_AMBULATORY_CARE_PROVIDER_SITE_OTHER): Payer: No Typology Code available for payment source | Admitting: *Deleted

## 2010-11-17 DIAGNOSIS — I4892 Unspecified atrial flutter: Secondary | ICD-10-CM

## 2010-11-17 LAB — POCT INR: INR: 3

## 2010-11-18 ENCOUNTER — Ambulatory Visit (INDEPENDENT_AMBULATORY_CARE_PROVIDER_SITE_OTHER): Payer: No Typology Code available for payment source | Admitting: *Deleted

## 2010-11-18 DIAGNOSIS — I495 Sick sinus syndrome: Secondary | ICD-10-CM

## 2010-11-18 NOTE — Progress Notes (Signed)
Wound check pacer 

## 2010-11-24 ENCOUNTER — Ambulatory Visit (INDEPENDENT_AMBULATORY_CARE_PROVIDER_SITE_OTHER): Payer: No Typology Code available for payment source | Admitting: *Deleted

## 2010-11-24 DIAGNOSIS — I4892 Unspecified atrial flutter: Secondary | ICD-10-CM

## 2010-11-24 LAB — POCT INR: INR: 2.2

## 2010-12-01 ENCOUNTER — Ambulatory Visit (INDEPENDENT_AMBULATORY_CARE_PROVIDER_SITE_OTHER): Payer: No Typology Code available for payment source | Admitting: *Deleted

## 2010-12-01 DIAGNOSIS — I4892 Unspecified atrial flutter: Secondary | ICD-10-CM

## 2010-12-02 NOTE — Consult Note (Signed)
NAME:  Mario Warner, Mario Warner NO.:  [id number]  MEDICAL RECORD NO.:  [id number]  LOCATION:  [facility]LED                         FACILITY:  [facility]  PHYSICIAN:  Rollene Rotunda, MD, FACCDATE OF BIRTH:  Oct 25, 1916  DATE OF CONSULTATION:  11/02/2010 DATE OF DISCHARGE:                                CONSULTATION   PRIMARY CARE PHYSICIAN:  Gaspar Garbe, M.D.  CARDIOLOGIST:  Hillis Range, M.D.  REASON FOR PRESENTATION:  Patient with altered mental status and questionable syncope.  HISTORY OF PRESENT ILLNESS:  The patient is a very pleasant 77 year old gentleman, who looks much younger than his stated age.  He was seen by Dr. Johney Frame in November of the last year when he had a presyncopal episode.  He had a sinus brady arrhythmia at that time; however, he was on some negative chronotropic drugs and these were discontinued.  He says he subsequently has had no further symptoms.  He has had no presyncope or syncope.  He has had no chest pressure, neck or arm discomfort.  He says he is quite active.  Today, he was at a car lawn.  He spoke with a friend for awhile; however, he suddenly felt like he needed to get something to eat.  He was weak and fatigued.  Apparently, went to his car where there may have been a loss of consciousness.  There are no witnesses to corroborate. He was told he was not acting correctly.  He was brought to the Emergency Room via EMS.  There he was found to be in atrial flutter with a slow ventricular rate, narrow complex escape.  He was hypertensive at 231/90.  He has also been hypoglycemic with blood sugars in the 40s.  He is denying any chest pressure, neck or arm discomfort.  There has been no shortness of breath, PND or orthopnea.  PAST MEDICAL HISTORY: 1. Diabetes. 2. Hypertension. 3. Dyslipidemia. 4. Benign prostatic hypertrophy. 5. Glaucoma. 6. Lumbar disk disease.  PAST SURGICAL HISTORY: 1. Hemorrhoidectomy. 2. TURP.  ALLERGIES:   None.  MEDICATIONS: 1. Flomax. 2. Lipitor 20 mg daily. 3. Losartan 125 mg daily. 4. Metformin 1000 mg b.i.d. 5. Humulin 70/30 20 units q.a.m. and 50 units q.p.m. 6. Eye drops (I do not yet know the names of these).  SOCIAL HISTORY:  The patient is a widower.  He lives alone.  He quit smoking about 40 years ago.  He is retired.  FAMILY HISTORY:  Noncontributory for early coronary artery disease.  REVIEW OF SYSTEMS:  As stated in the HPI and positive for constipation. Otherwise, negative for all other systems.  PHYSICAL EXAMINATION:  GENERAL:  The patient is pleasant and in no distress. VITAL SIGNS:  Blood pressure 199/60, heart rate 58 and regular, afebrile, respiratory rate 16. HEENT:  Eyelids are unremarkable.  Pupils equal, round and reactive to light.  Fundi not visualized.  Oral mucosa unremarkable.  Edentulous. NECK:  No jugular venous distention at 45 degrees.  Carotid upstroke brisk and symmetric, transmitted systolic murmur versus bruit.  No thyromegaly. LYMPHATICS:  No cervical, axillary or inguinal adenopathy. LUNGS:  Clear to auscultation bilaterally. BACK:  No costovertebral angle tenderness. CHEST:  Unremarkable. HEART:  PMI not displaced or sustained, S1 and S2 within normal limits. No S3.  No S4.  No clicks.  No rubs.  A 2/6 apical systolic murmur radiating slightly out the aortic outflow tract.  No diastolic murmurs. ABDOMEN:  Flat, positive bowel sounds, normal in frequency and pitch. No bruits.  No rebound.  No guarding.  No midline pulsatile mass.  No hepatomegaly.  No splenomegaly. SKIN:  No rashes.  No nodules. EXTREMITIES:  A 2+ pulses throughout.  No edema.  No cyanosis or clubbing. NEUROLOGIC:  Oriented to person, place, time.  Cranial nerves II through XII grossly intact.  Motor grossly intact throughout.  DIAGNOSTIC STUDIES:  EKG atrial flutter, typical pattern, bradycardic ventricular escape probably junctional with minimal QRS widening,  left axis deviation, left ventricular hypertrophy by voltage criteria.  Chest x-ray, no disease.  LABORATORY DATA:  WBC 4.7, hemoglobin 12.0.  Sodium 136, potassium 4.0. Platelets 139.  BUN 36, creatinine 1.6.  ASSESSMENT/PLAN: 1. Altered mental status:  Patient had an episode of altered mental     status.  The etiology is not entirely clear.  It could be related     to hypoglycemia.  It also could be a bradycardic rhythm.  These are     discussed below.  Currently, he is clear and is having no     complaints. 2. Tachybrady syndrome:  The patient has atrial flutter with a slow     ventricular rate.  He has had symptoms before related to brady     arrhythmias.  At this point, pacemaker would be indicated and I     will discuss with EP.  I am going to start him on heparin and     Coumadin for the atrial flutter.  I will check an echocardiogram. 3. Hypoglycemia:  Discussed this with the Emergency Room physicians.     They will correct this before transfer to [facility].  I am going to hold     his metformin.  He is going to be able to eat and I will make him     n.p.o. until we know his hypoglycemia is corrected.  He will have a     very low level sliding scale insulin only to be     administered if his sugars sustain above 150. 4. Hypertension:  I will could continue Cozaar 100 mg daily.  I will     use nitroglycerin paste.  He might need hydralazine p.r.n.     Rollene Rotunda, MD, [facility]     JH/MEDQ  D:  11/02/2010  T:  11/02/2010  Job:  [id number]  cc:   Gaspar Garbe, M.D. Fax: [phone]  Electronically Signed by Rollene Rotunda MD [facility] on 12/02/2010 11:44:15 AM

## 2010-12-11 ENCOUNTER — Ambulatory Visit (INDEPENDENT_AMBULATORY_CARE_PROVIDER_SITE_OTHER): Payer: No Typology Code available for payment source | Admitting: *Deleted

## 2010-12-11 DIAGNOSIS — I4892 Unspecified atrial flutter: Secondary | ICD-10-CM

## 2010-12-11 LAB — POCT INR: INR: 3.3

## 2010-12-13 HISTORY — PX: CATARACT EXTRACTION: SUR2

## 2010-12-25 ENCOUNTER — Ambulatory Visit (INDEPENDENT_AMBULATORY_CARE_PROVIDER_SITE_OTHER): Payer: No Typology Code available for payment source | Admitting: *Deleted

## 2010-12-25 DIAGNOSIS — I4892 Unspecified atrial flutter: Secondary | ICD-10-CM

## 2010-12-25 LAB — POCT INR: INR: 1.5

## 2011-01-06 ENCOUNTER — Encounter: Payer: Self-pay | Admitting: Internal Medicine

## 2011-01-08 ENCOUNTER — Ambulatory Visit (INDEPENDENT_AMBULATORY_CARE_PROVIDER_SITE_OTHER): Payer: No Typology Code available for payment source | Admitting: *Deleted

## 2011-01-08 DIAGNOSIS — I4892 Unspecified atrial flutter: Secondary | ICD-10-CM

## 2011-01-08 LAB — POCT INR: INR: 1.3

## 2011-01-22 ENCOUNTER — Ambulatory Visit (INDEPENDENT_AMBULATORY_CARE_PROVIDER_SITE_OTHER): Payer: No Typology Code available for payment source | Admitting: *Deleted

## 2011-01-22 DIAGNOSIS — I4892 Unspecified atrial flutter: Secondary | ICD-10-CM

## 2011-02-05 ENCOUNTER — Ambulatory Visit (INDEPENDENT_AMBULATORY_CARE_PROVIDER_SITE_OTHER): Payer: No Typology Code available for payment source | Admitting: *Deleted

## 2011-02-05 ENCOUNTER — Encounter: Payer: No Typology Code available for payment source | Admitting: Internal Medicine

## 2011-02-05 ENCOUNTER — Encounter: Payer: Self-pay | Admitting: Internal Medicine

## 2011-02-05 ENCOUNTER — Ambulatory Visit (INDEPENDENT_AMBULATORY_CARE_PROVIDER_SITE_OTHER): Payer: No Typology Code available for payment source | Admitting: Internal Medicine

## 2011-02-05 DIAGNOSIS — I4892 Unspecified atrial flutter: Secondary | ICD-10-CM

## 2011-02-05 DIAGNOSIS — I10 Essential (primary) hypertension: Secondary | ICD-10-CM

## 2011-02-05 DIAGNOSIS — I495 Sick sinus syndrome: Secondary | ICD-10-CM

## 2011-02-05 DIAGNOSIS — I442 Atrioventricular block, complete: Secondary | ICD-10-CM | POA: Insufficient documentation

## 2011-02-05 DIAGNOSIS — R001 Bradycardia, unspecified: Secondary | ICD-10-CM

## 2011-02-05 DIAGNOSIS — I498 Other specified cardiac arrhythmias: Secondary | ICD-10-CM

## 2011-02-05 HISTORY — DX: Atrioventricular block, complete: I44.2

## 2011-02-05 LAB — PACEMAKER DEVICE OBSERVATION
AL IMPEDENCE PM: 450 Ohm
ATRIAL PACING PM: 0
BATTERY VOLTAGE: 2.9779 V
RV LEAD IMPEDENCE PM: 600 Ohm
VENTRICULAR PACING PM: 89

## 2011-02-05 NOTE — Assessment & Plan Note (Signed)
Stable No change required today  

## 2011-02-05 NOTE — Progress Notes (Signed)
The patient presents today for routine electrophysiology followup.  Since having his pacemaker implanted, the patient reports doing very well.  His dizziness has resolved and energy has improved.  Today, he denies symptoms of palpitations, chest pain, shortness of breath, orthopnea, PND, lower extremity edema,  presyncope, syncope, or neurologic sequela.  The patient feels that he is tolerating medications without difficulties and is otherwise without complaint today.   Past Medical History  Diagnosis Date  . DJD (degenerative joint disease)   . Lumbar disc disease   . Glaucoma   . DM (diabetes mellitus)   . Benign prostatic hypertrophy   . Hyperlipidemia   . HTN (hypertension)   . Bradycardia     s/p PPM by Dr Johney Frame  . Atrial flutter     chronically anticoagulated with coumadin   Past Surgical History  Procedure Date  . Transurethral resection of prostate   . Hemorrhoid surgery   . Pacemaker insertion 5/12    by Dr Johney Frame    Current Outpatient Prescriptions  Medication Sig Dispense Refill  . atorvastatin (LIPITOR) 20 MG tablet Take 20 mg by mouth daily.        . bimatoprost (LUMIGAN) 0.03 % ophthalmic solution Place 1 drop into both eyes at bedtime.        . carvedilol (COREG) 12.5 MG tablet Take 12.5 mg by mouth 2 (two) times daily with a meal.        . digoxin (LANOXIN) 0.25 MG tablet Take 0.125 mg by mouth daily.        . dorzolamide (TRUSOPT) 2 % ophthalmic solution Place 1 drop into both eyes 3 (three) times daily.        . insulin NPH-insulin regular (NOVOLIN 70/30) (70-30) 100 UNIT/ML injection Use as directed       . losartan-hydrochlorothiazide (HYZAAR) 100-25 MG per tablet Take 1 tablet by mouth daily.        . metFORMIN (GLUCOPHAGE) 1000 MG tablet Take 1,000 mg by mouth daily.        . Tamsulosin HCl (FLOMAX) 0.4 MG CAPS Take 0.4 mg by mouth daily.        . timolol (BETIMOL) 0.5 % ophthalmic solution Place 1 drop into both eyes daily.        Marland Kitchen warfarin (COUMADIN) 5 MG  tablet Take 5 mg by mouth as directed.          No Known Allergies  History   Social History  . Marital Status: Widowed    Spouse Name: N/A    Number of Children: N/A  . Years of Education: N/A   Occupational History  . Not on file.   Social History Main Topics  . Smoking status: Former Smoker    Quit date: 02/05/1971  . Smokeless tobacco: Not on file  . Alcohol Use: No  . Drug Use: No  . Sexually Active: Not on file   Other Topics Concern  . Not on file   Social History Narrative  . No narrative on file    Family History  Problem Relation Age of Onset  . Diabetes Mother     ROS-  All systems are reviewed and are negative except as outlined in the HPI above    Physical Exam: Filed Vitals:   02/05/11 1203  BP: 140/74  Pulse: 50  Height: 6\' 3"  (1.905 m)  Weight: 183 lb 6.4 oz (83.19 kg)    GEN- The patient is well appearing, alert and oriented x 3 today.  Head- normocephalic, atraumatic Eyes-  Sclera clear, conjunctiva pink Ears- hearing intact Oropharynx- clear Neck- supple, no JVP Lymph- no cervical lymphadenopathy Lungs- Clear to ausculation bilaterally, normal work of breathing Chest- pacemaker pocket is well healed Heart- Regular rate and rhythm (paced)  GI- soft, NT, ND, + BS Extremities- no clubbing, cyanosis, or edema MS- no significant deformity or atrophy Skin- no rash or lesion Psych- euthymic mood, full affect Neuro- strength and sensation are intact  Pacemaker interrogation- reviewed in detail today,  See PACEART report  Assessment and Plan:

## 2011-02-05 NOTE — Patient Instructions (Signed)
Your physician wants you to follow-up in: 10/2011 with Dr Allred You will receive a reminder letter in the mail two months in advance. If you don't receive a letter, please call our office to schedule the follow-up appointment.  

## 2011-02-05 NOTE — Assessment & Plan Note (Signed)
Normal pacemaker function See Pace Art report No changes today  

## 2011-02-05 NOTE — Assessment & Plan Note (Signed)
Asymptomatic We discussed catheter ablation as an option to be able to stop coumadin longterm  At this time, he is doing well with coumadin and does not wish to proceed with ablation Given his advanced age, I think that his interest in avoiding procedures is reasonable.

## 2011-02-17 ENCOUNTER — Emergency Department (HOSPITAL_COMMUNITY): Payer: No Typology Code available for payment source

## 2011-02-17 ENCOUNTER — Emergency Department (HOSPITAL_COMMUNITY)
Admission: EM | Admit: 2011-02-17 | Discharge: 2011-02-17 | Disposition: A | Discharge status: Home / Self Care | Payer: No Typology Code available for payment source | Attending: Emergency Medicine | Admitting: Emergency Medicine

## 2011-02-17 DIAGNOSIS — Z950 Presence of cardiac pacemaker: Secondary | ICD-10-CM | POA: Insufficient documentation

## 2011-02-17 DIAGNOSIS — Z7901 Long term (current) use of anticoagulants: Secondary | ICD-10-CM | POA: Insufficient documentation

## 2011-02-17 DIAGNOSIS — R42 Dizziness and giddiness: Secondary | ICD-10-CM | POA: Insufficient documentation

## 2011-02-17 DIAGNOSIS — E1169 Type 2 diabetes mellitus with other specified complication: Secondary | ICD-10-CM | POA: Insufficient documentation

## 2011-02-17 DIAGNOSIS — R4182 Altered mental status, unspecified: Secondary | ICD-10-CM | POA: Insufficient documentation

## 2011-02-17 DIAGNOSIS — R5381 Other malaise: Secondary | ICD-10-CM | POA: Insufficient documentation

## 2011-02-17 DIAGNOSIS — Z79899 Other long term (current) drug therapy: Secondary | ICD-10-CM | POA: Insufficient documentation

## 2011-02-17 DIAGNOSIS — R61 Generalized hyperhidrosis: Secondary | ICD-10-CM | POA: Insufficient documentation

## 2011-02-17 DIAGNOSIS — R5383 Other fatigue: Secondary | ICD-10-CM | POA: Insufficient documentation

## 2011-02-17 DIAGNOSIS — Z794 Long term (current) use of insulin: Secondary | ICD-10-CM | POA: Insufficient documentation

## 2011-02-17 LAB — POCT I-STAT, CHEM 8
Hemoglobin: 13.6 g/dL (ref 13.0–17.0)
Potassium: 4.1 mEq/L (ref 3.5–5.1)
Sodium: 143 mEq/L (ref 135–145)
TCO2: 22 mmol/L (ref 0–100)

## 2011-02-17 LAB — URINALYSIS, ROUTINE W REFLEX MICROSCOPIC
Bilirubin Urine: NEGATIVE
Glucose, UA: 100 mg/dL — AB
Ketones, ur: NEGATIVE mg/dL
Protein, ur: 100 mg/dL — AB

## 2011-02-17 LAB — GLUCOSE, CAPILLARY
Glucose-Capillary: 126 mg/dL — ABNORMAL HIGH (ref 70–99)
Glucose-Capillary: 107 mg/dL — ABNORMAL HIGH (ref 70–99)
Glucose-Capillary: 150 mg/dL — ABNORMAL HIGH (ref 70–99)

## 2011-02-17 LAB — URINE MICROSCOPIC-ADD ON

## 2011-02-17 LAB — PROTIME-INR
INR: 2.89 — ABNORMAL HIGH (ref 0.00–1.49)
Prothrombin Time: 30.7 seconds — ABNORMAL HIGH (ref 11.6–15.2)

## 2011-02-17 LAB — POCT I-STAT TROPONIN I: Troponin i, poc: 0 ng/mL (ref 0.00–0.08)

## 2011-02-18 LAB — GLUCOSE, CAPILLARY

## 2011-02-19 ENCOUNTER — Ambulatory Visit (INDEPENDENT_AMBULATORY_CARE_PROVIDER_SITE_OTHER): Payer: No Typology Code available for payment source | Admitting: *Deleted

## 2011-02-19 DIAGNOSIS — I4892 Unspecified atrial flutter: Secondary | ICD-10-CM

## 2011-03-05 ENCOUNTER — Ambulatory Visit (INDEPENDENT_AMBULATORY_CARE_PROVIDER_SITE_OTHER): Payer: No Typology Code available for payment source | Admitting: *Deleted

## 2011-03-05 DIAGNOSIS — I4892 Unspecified atrial flutter: Secondary | ICD-10-CM

## 2011-03-26 ENCOUNTER — Ambulatory Visit (INDEPENDENT_AMBULATORY_CARE_PROVIDER_SITE_OTHER): Payer: No Typology Code available for payment source | Admitting: *Deleted

## 2011-03-26 DIAGNOSIS — I4892 Unspecified atrial flutter: Secondary | ICD-10-CM

## 2011-03-26 LAB — POCT INR: INR: 2.7

## 2011-04-23 ENCOUNTER — Ambulatory Visit (INDEPENDENT_AMBULATORY_CARE_PROVIDER_SITE_OTHER): Payer: No Typology Code available for payment source | Admitting: *Deleted

## 2011-04-23 DIAGNOSIS — I4892 Unspecified atrial flutter: Secondary | ICD-10-CM

## 2011-04-23 LAB — POCT INR: INR: 1.8

## 2011-05-07 ENCOUNTER — Ambulatory Visit (INDEPENDENT_AMBULATORY_CARE_PROVIDER_SITE_OTHER): Payer: No Typology Code available for payment source | Admitting: *Deleted

## 2011-05-07 DIAGNOSIS — I4892 Unspecified atrial flutter: Secondary | ICD-10-CM

## 2011-06-04 ENCOUNTER — Ambulatory Visit (INDEPENDENT_AMBULATORY_CARE_PROVIDER_SITE_OTHER): Payer: No Typology Code available for payment source | Admitting: *Deleted

## 2011-06-04 DIAGNOSIS — I4892 Unspecified atrial flutter: Secondary | ICD-10-CM

## 2011-06-18 ENCOUNTER — Ambulatory Visit (INDEPENDENT_AMBULATORY_CARE_PROVIDER_SITE_OTHER): Payer: No Typology Code available for payment source | Admitting: *Deleted

## 2011-06-18 DIAGNOSIS — I4892 Unspecified atrial flutter: Secondary | ICD-10-CM

## 2011-06-18 LAB — POCT INR: INR: 3.6

## 2011-07-02 ENCOUNTER — Encounter: Payer: No Typology Code available for payment source | Admitting: *Deleted

## 2011-07-05 ENCOUNTER — Ambulatory Visit (INDEPENDENT_AMBULATORY_CARE_PROVIDER_SITE_OTHER): Payer: No Typology Code available for payment source | Admitting: *Deleted

## 2011-07-05 DIAGNOSIS — I4892 Unspecified atrial flutter: Secondary | ICD-10-CM

## 2011-07-26 ENCOUNTER — Ambulatory Visit (INDEPENDENT_AMBULATORY_CARE_PROVIDER_SITE_OTHER): Payer: No Typology Code available for payment source | Admitting: *Deleted

## 2011-07-26 DIAGNOSIS — I4892 Unspecified atrial flutter: Secondary | ICD-10-CM

## 2011-07-26 NOTE — Patient Instructions (Signed)
Re-instruct moderation, consistency with leafy green vegetables

## 2011-08-05 ENCOUNTER — Other Ambulatory Visit (HOSPITAL_COMMUNITY): Payer: Self-pay | Admitting: Internal Medicine

## 2011-08-09 ENCOUNTER — Ambulatory Visit (INDEPENDENT_AMBULATORY_CARE_PROVIDER_SITE_OTHER): Payer: No Typology Code available for payment source | Admitting: *Deleted

## 2011-08-09 DIAGNOSIS — I4892 Unspecified atrial flutter: Secondary | ICD-10-CM

## 2011-08-26 ENCOUNTER — Encounter: Payer: Self-pay | Admitting: Internal Medicine

## 2011-08-30 ENCOUNTER — Ambulatory Visit (INDEPENDENT_AMBULATORY_CARE_PROVIDER_SITE_OTHER): Payer: No Typology Code available for payment source | Admitting: *Deleted

## 2011-08-30 DIAGNOSIS — I4892 Unspecified atrial flutter: Secondary | ICD-10-CM

## 2011-08-30 LAB — POCT INR: INR: 1.5

## 2011-09-13 ENCOUNTER — Ambulatory Visit (INDEPENDENT_AMBULATORY_CARE_PROVIDER_SITE_OTHER): Payer: No Typology Code available for payment source | Admitting: Pharmacist

## 2011-09-13 DIAGNOSIS — I4892 Unspecified atrial flutter: Secondary | ICD-10-CM

## 2011-10-04 ENCOUNTER — Ambulatory Visit (INDEPENDENT_AMBULATORY_CARE_PROVIDER_SITE_OTHER): Payer: No Typology Code available for payment source

## 2011-10-04 DIAGNOSIS — I4892 Unspecified atrial flutter: Secondary | ICD-10-CM

## 2011-10-15 ENCOUNTER — Other Ambulatory Visit: Payer: Self-pay | Admitting: *Deleted

## 2011-10-15 MED ORDER — WARFARIN SODIUM 5 MG PO TABS: ORAL_TABLET | ORAL | Status: DC

## 2011-11-01 ENCOUNTER — Encounter: Payer: Self-pay | Admitting: Internal Medicine

## 2011-11-01 ENCOUNTER — Ambulatory Visit (INDEPENDENT_AMBULATORY_CARE_PROVIDER_SITE_OTHER): Payer: No Typology Code available for payment source | Admitting: Pharmacist

## 2011-11-01 ENCOUNTER — Ambulatory Visit (INDEPENDENT_AMBULATORY_CARE_PROVIDER_SITE_OTHER): Payer: No Typology Code available for payment source | Admitting: Internal Medicine

## 2011-11-01 VITALS — BP 132/68 | HR 64 | Ht 75.0 in | Wt 180.0 lb

## 2011-11-01 DIAGNOSIS — I4892 Unspecified atrial flutter: Secondary | ICD-10-CM

## 2011-11-01 DIAGNOSIS — I442 Atrioventricular block, complete: Secondary | ICD-10-CM

## 2011-11-01 DIAGNOSIS — I495 Sick sinus syndrome: Secondary | ICD-10-CM

## 2011-11-01 DIAGNOSIS — R0989 Other specified symptoms and signs involving the circulatory and respiratory systems: Secondary | ICD-10-CM

## 2011-11-01 LAB — PACEMAKER DEVICE OBSERVATION
BAMS-0001: 130 {beats}/min
BAMS-0003: 50 {beats}/min
BATTERY VOLTAGE: 2.95 V
BRDY-0002RV: 50 {beats}/min
BRDY-0003RV: 100 {beats}/min
BRDY-0004RV: 100 {beats}/min
DEVICE MODEL PM: 2660317
RV LEAD AMPLITUDE: 8.6 mv
RV LEAD IMPEDENCE PM: 550 Ohm
RV LEAD THRESHOLD: 0.625 V
VENTRICULAR PACING PM: 92

## 2011-11-01 LAB — POCT INR: INR: 2.7

## 2011-11-01 NOTE — Assessment & Plan Note (Signed)
Asymptomatic He declines ablation Continue coumadin long term

## 2011-11-01 NOTE — Progress Notes (Signed)
PCP: Gaspar Garbe, MD, MD  Mario Warner is a 78 y.o. male who presents today for routine electrophysiology followup.  Since last being seen in our clinic, the patient reports doing very well.  Today, he denies symptoms of palpitations, chest pain, shortness of breath,  lower extremity edema, dizziness, presyncope, or syncope.  The patient is otherwise without complaint today.   Past Medical History  Diagnosis Date  . DJD (degenerative joint disease)   . Lumbar disc disease   . Glaucoma   . DM (diabetes mellitus)   . Benign prostatic hypertrophy   . Hyperlipidemia   . HTN (hypertension)   . Bradycardia     s/p PPM by Dr Johney Frame  . Atrial flutter     chronically anticoagulated with coumadin   Past Surgical History  Procedure Date  . Transurethral resection of prostate   . Hemorrhoid surgery   . Pacemaker insertion 5/12    by Dr Johney Frame    Current Outpatient Prescriptions  Medication Sig Dispense Refill  . atorvastatin (LIPITOR) 20 MG tablet Take 20 mg by mouth daily.        . bimatoprost (LUMIGAN) 0.03 % ophthalmic solution Place 1 drop into both eyes at bedtime.        . carvedilol (COREG) 12.5 MG tablet TAKE 1 TABLET BY MOUTH TWICE DAILY WITH MEALS  60 tablet  6  . digoxin (LANOXIN) 0.25 MG tablet Take 0.125 mg by mouth daily.        . dorzolamide (TRUSOPT) 2 % ophthalmic solution Place 1 drop into both eyes 3 (three) times daily.        . insulin NPH-insulin regular (NOVOLIN 70/30) (70-30) 100 UNIT/ML injection Use as directed       . losartan-hydrochlorothiazide (HYZAAR) 100-25 MG per tablet Take 1 tablet by mouth daily.        . metFORMIN (GLUCOPHAGE) 1000 MG tablet Take 1,000 mg by mouth daily.        . Tamsulosin HCl (FLOMAX) 0.4 MG CAPS Take 0.4 mg by mouth daily.        . timolol (BETIMOL) 0.5 % ophthalmic solution Place 1 drop into both eyes daily.        Marland Kitchen warfarin (COUMADIN) 5 MG tablet Take as directed by anticoagulation clinic.  Pt takes up to 1 1/2 tablets  daily.  45 tablet  2    Physical Exam: Filed Vitals:   11/01/11 1039  BP: 132/68  Pulse: 64  Height: 6\' 3"  (1.905 m)  Weight: 180 lb (81.647 kg)    GEN- The patient is well appearing, alert and oriented x 3 today.   Head- normocephalic, atraumatic Eyes-  Sclera clear, conjunctiva pink Ears- hearing intact Oropharynx- clear Lungs- Clear to ausculation bilaterally, normal work of breathing Chest- pacemaker pocket is well healed Heart- Regular rate and rhythm (paced)  GI- soft, NT, ND, + BS Extremities- no clubbing, cyanosis, or edema  Pacemaker interrogation- reviewed in detail today,  See PACEART report  Assessment and Plan:

## 2011-11-01 NOTE — Assessment & Plan Note (Signed)
Normal pacemaker function See Pace Art report No changes today  

## 2011-11-01 NOTE — Patient Instructions (Signed)
Your physician wants you to follow-up in: 12 months with Dr Allred You will receive a reminder letter in the mail two months in advance. If you don't receive a letter, please call our office to schedule the follow-up appointment.  

## 2011-11-11 ENCOUNTER — Other Ambulatory Visit (HOSPITAL_COMMUNITY): Payer: Self-pay | Admitting: Internal Medicine

## 2011-11-29 ENCOUNTER — Ambulatory Visit (INDEPENDENT_AMBULATORY_CARE_PROVIDER_SITE_OTHER): Payer: No Typology Code available for payment source

## 2011-11-29 DIAGNOSIS — I4892 Unspecified atrial flutter: Secondary | ICD-10-CM

## 2011-11-29 LAB — POCT INR: INR: 1.2

## 2011-12-24 ENCOUNTER — Encounter (HOSPITAL_COMMUNITY): Payer: Self-pay | Admitting: Emergency Medicine

## 2011-12-24 ENCOUNTER — Emergency Department (HOSPITAL_COMMUNITY): Payer: No Typology Code available for payment source

## 2011-12-24 ENCOUNTER — Inpatient Hospital Stay (HOSPITAL_COMMUNITY)
Admission: EM | Admit: 2011-12-24 | Discharge: 2011-12-27 | DRG: 637 | Disposition: A | Discharge status: Home Health | Payer: No Typology Code available for payment source | Attending: Internal Medicine | Admitting: Internal Medicine

## 2011-12-24 DIAGNOSIS — S065X9A Traumatic subdural hemorrhage with loss of consciousness of unspecified duration, initial encounter: Secondary | ICD-10-CM

## 2011-12-24 DIAGNOSIS — I10 Essential (primary) hypertension: Secondary | ICD-10-CM | POA: Diagnosis present

## 2011-12-24 DIAGNOSIS — I4892 Unspecified atrial flutter: Secondary | ICD-10-CM

## 2011-12-24 DIAGNOSIS — Z87891 Personal history of nicotine dependence: Secondary | ICD-10-CM

## 2011-12-24 DIAGNOSIS — R55 Syncope and collapse: Secondary | ICD-10-CM

## 2011-12-24 DIAGNOSIS — Z794 Long term (current) use of insulin: Secondary | ICD-10-CM

## 2011-12-24 DIAGNOSIS — E1129 Type 2 diabetes mellitus with other diabetic kidney complication: Secondary | ICD-10-CM | POA: Diagnosis present

## 2011-12-24 DIAGNOSIS — Z950 Presence of cardiac pacemaker: Secondary | ICD-10-CM

## 2011-12-24 DIAGNOSIS — F0390 Unspecified dementia without behavioral disturbance: Secondary | ICD-10-CM | POA: Diagnosis present

## 2011-12-24 DIAGNOSIS — E785 Hyperlipidemia, unspecified: Secondary | ICD-10-CM | POA: Diagnosis present

## 2011-12-24 DIAGNOSIS — S065XAA Traumatic subdural hemorrhage with loss of consciousness status unknown, initial encounter: Secondary | ICD-10-CM

## 2011-12-24 DIAGNOSIS — M1990 Unspecified osteoarthritis, unspecified site: Secondary | ICD-10-CM

## 2011-12-24 DIAGNOSIS — E1169 Type 2 diabetes mellitus with other specified complication: Principal | ICD-10-CM | POA: Diagnosis present

## 2011-12-24 DIAGNOSIS — H409 Unspecified glaucoma: Secondary | ICD-10-CM | POA: Diagnosis present

## 2011-12-24 DIAGNOSIS — Z79899 Other long term (current) drug therapy: Secondary | ICD-10-CM

## 2011-12-24 DIAGNOSIS — I6200 Nontraumatic subdural hemorrhage, unspecified: Secondary | ICD-10-CM

## 2011-12-24 DIAGNOSIS — K625 Hemorrhage of anus and rectum: Secondary | ICD-10-CM | POA: Diagnosis present

## 2011-12-24 DIAGNOSIS — Z833 Family history of diabetes mellitus: Secondary | ICD-10-CM

## 2011-12-24 HISTORY — DX: Presence of cardiac pacemaker: Z95.0

## 2011-12-24 HISTORY — DX: Traumatic subdural hemorrhage with loss of consciousness status unknown, initial encounter: S06.5XAA

## 2011-12-24 HISTORY — DX: Syncope and collapse: R55

## 2011-12-24 HISTORY — DX: Traumatic subdural hemorrhage with loss of consciousness of unspecified duration, initial encounter: S06.5X9A

## 2011-12-24 LAB — URINE MICROSCOPIC-ADD ON

## 2011-12-24 LAB — COMPREHENSIVE METABOLIC PANEL
Albumin: 3.5 g/dL (ref 3.5–5.2)
Alkaline Phosphatase: 90 U/L (ref 39–117)
BUN: 25 mg/dL — ABNORMAL HIGH (ref 6–23)
Potassium: 3.9 mEq/L (ref 3.5–5.1)
Sodium: 141 mEq/L (ref 135–145)
Total Protein: 7.4 g/dL (ref 6.0–8.3)

## 2011-12-24 LAB — CARDIAC PANEL(CRET KIN+CKTOT+MB+TROPI)
CK, MB: 2.4 ng/mL (ref 0.3–4.0)
Total CK: 103 U/L (ref 7–232)

## 2011-12-24 LAB — TROPONIN I: Troponin I: <0.3 ng/mL (ref <0.30)

## 2011-12-24 LAB — CBC WITH DIFFERENTIAL/PLATELET
Basophils Relative: 1 % (ref 0–1)
Eosinophils Absolute: 0.1 10*3/uL (ref 0.0–0.7)
MCH: 27.6 pg (ref 26.0–34.0)
MCHC: 33.3 g/dL (ref 30.0–36.0)
Monocytes Relative: 7 % (ref 3–12)
Neutrophils Relative %: 68 % (ref 43–77)
Platelets: 125 10*3/uL — ABNORMAL LOW (ref 150–400)

## 2011-12-24 LAB — URINALYSIS, ROUTINE W REFLEX MICROSCOPIC
Bilirubin Urine: NEGATIVE
Leukocytes, UA: NEGATIVE
Nitrite: NEGATIVE
Specific Gravity, Urine: 1.019 (ref 1.005–1.030)
pH: 5.5 (ref 5.0–8.0)

## 2011-12-24 LAB — PROTIME-INR
INR: 1.17 (ref 0.00–1.49)
Prothrombin Time: 15.1 seconds (ref 11.6–15.2)

## 2011-12-24 LAB — GLUCOSE, CAPILLARY: Glucose-Capillary: 121 mg/dL — ABNORMAL HIGH (ref 70–99)

## 2011-12-24 MED ORDER — SODIUM CHLORIDE 0.9 % IJ SOLN
3.0000 mL | Freq: Two times a day (BID) | INTRAMUSCULAR | Status: DC
  Administered 2011-12-24 – 2011-12-25 (×2): 3 mL via INTRAVENOUS

## 2011-12-24 MED ORDER — ACETAMINOPHEN 325 MG PO TABS
650.0000 mg | ORAL_TABLET | Freq: Four times a day (QID) | ORAL | Status: DC | PRN
  Administered 2011-12-26 – 2011-12-27 (×2): 650 mg via ORAL
  Filled 2011-12-24 (×2): qty 2

## 2011-12-24 MED ORDER — SODIUM CHLORIDE 0.9 % IV SOLN: 250.0000 mL | INTRAVENOUS | Status: DC | PRN

## 2011-12-24 MED ORDER — HYDRALAZINE HCL 20 MG/ML IJ SOLN: 10.0000 mg | Freq: Four times a day (QID) | INTRAMUSCULAR | Status: DC | PRN

## 2011-12-24 MED ORDER — INSULIN ASPART 100 UNIT/ML ~~LOC~~ SOLN
0.0000 [IU] | Freq: Three times a day (TID) | SUBCUTANEOUS | Status: DC
  Administered 2011-12-25: 2 [IU] via SUBCUTANEOUS
  Administered 2011-12-26: 3 [IU] via SUBCUTANEOUS
  Administered 2011-12-26: 2 [IU] via SUBCUTANEOUS

## 2011-12-24 MED ORDER — CARVEDILOL 12.5 MG PO TABS: 12.5000 mg | ORAL_TABLET | Freq: Two times a day (BID) | ORAL | Status: DC

## 2011-12-24 MED ORDER — BIMATOPROST 0.03 % OP SOLN
1.0000 [drp] | Freq: Every day | OPHTHALMIC | Status: DC
  Filled 2011-12-24: qty 2.5

## 2011-12-24 MED ORDER — ALUM & MAG HYDROXIDE-SIMETH 200-200-20 MG/5ML PO SUSP: 30.0000 mL | Freq: Four times a day (QID) | ORAL | Status: DC | PRN

## 2011-12-24 MED ORDER — SODIUM CHLORIDE 0.9 % IV SOLN: INTRAVENOUS | Status: DC

## 2011-12-24 MED ORDER — ATORVASTATIN CALCIUM 20 MG PO TABS
20.0000 mg | ORAL_TABLET | Freq: Every day | ORAL | Status: DC
  Administered 2011-12-25 – 2011-12-27 (×3): 20 mg via ORAL
  Filled 2011-12-24 (×3): qty 1

## 2011-12-24 MED ORDER — SODIUM CHLORIDE 0.9 % IJ SOLN: 3.0000 mL | INTRAMUSCULAR | Status: DC | PRN

## 2011-12-24 MED ORDER — ONDANSETRON HCL 4 MG/2ML IJ SOLN: 4.0000 mg | Freq: Three times a day (TID) | INTRAMUSCULAR | Status: AC | PRN

## 2011-12-24 MED ORDER — SODIUM CHLORIDE 0.9 % IJ SOLN
3.0000 mL | Freq: Two times a day (BID) | INTRAMUSCULAR | Status: DC
  Administered 2011-12-25 – 2011-12-26 (×2): 3 mL via INTRAVENOUS

## 2011-12-24 MED ORDER — HYDROCHLOROTHIAZIDE 25 MG PO TABS
25.0000 mg | ORAL_TABLET | Freq: Every day | ORAL | Status: DC
  Administered 2011-12-25 – 2011-12-27 (×3): 25 mg via ORAL
  Filled 2011-12-24 (×3): qty 1

## 2011-12-24 MED ORDER — ACETAMINOPHEN 650 MG RE SUPP: 650.0000 mg | Freq: Four times a day (QID) | RECTAL | Status: DC | PRN

## 2011-12-24 MED ORDER — CARVEDILOL 12.5 MG PO TABS
12.5000 mg | ORAL_TABLET | Freq: Two times a day (BID) | ORAL | Status: DC
  Administered 2011-12-24 – 2011-12-27 (×6): 12.5 mg via ORAL
  Filled 2011-12-24 (×9): qty 1

## 2011-12-24 MED ORDER — LOSARTAN POTASSIUM-HCTZ 100-25 MG PO TABS: 1.0000 | ORAL_TABLET | Freq: Every day | ORAL | Status: DC

## 2011-12-24 MED ORDER — AMLODIPINE BESYLATE 5 MG PO TABS
5.0000 mg | ORAL_TABLET | Freq: Every day | ORAL | Status: DC
  Administered 2011-12-24 – 2011-12-27 (×4): 5 mg via ORAL
  Filled 2011-12-24 (×4): qty 1

## 2011-12-24 MED ORDER — LOSARTAN POTASSIUM 50 MG PO TABS
100.0000 mg | ORAL_TABLET | Freq: Every day | ORAL | Status: DC
  Administered 2011-12-25 – 2011-12-27 (×3): 100 mg via ORAL
  Filled 2011-12-24 (×3): qty 2

## 2011-12-24 MED ORDER — SODIUM CHLORIDE 0.9 % IV SOLN: INTRAVENOUS | Status: AC

## 2011-12-24 MED ORDER — INSULIN GLARGINE 100 UNIT/ML ~~LOC~~ SOLN
20.0000 [IU] | Freq: Every day | SUBCUTANEOUS | Status: DC
  Administered 2011-12-24: 20 [IU] via SUBCUTANEOUS

## 2011-12-24 MED ORDER — DIGOXIN 125 MCG PO TABS
0.1250 mg | ORAL_TABLET | Freq: Every day | ORAL | Status: DC
  Administered 2011-12-25 – 2011-12-27 (×3): 0.125 mg via ORAL
  Filled 2011-12-24 (×3): qty 1

## 2011-12-24 MED ORDER — DORZOLAMIDE HCL 2 % OP SOLN
1.0000 [drp] | Freq: Three times a day (TID) | OPHTHALMIC | Status: DC
  Filled 2011-12-24: qty 10

## 2011-12-24 MED ORDER — INSULIN ASPART 100 UNIT/ML ~~LOC~~ SOLN: 0.0000 [IU] | Freq: Every day | SUBCUTANEOUS | Status: DC

## 2011-12-24 NOTE — ED Notes (Signed)
Pt. States "The last thing I remember was that I needed to go to the bathroom".  States he was having a BM.  Reports this has happened once in the past but doesn't know why. Radial pulses strong, cap refill <3 sec. States "I feel back to normal now". Alert and oriented X4.

## 2011-12-24 NOTE — H&P (Signed)
PCP:   Gaspar Garbe, MD   Chief Complaint:  Loss of consciousness  HPI: Mario Warner is a 78 year old African American male with a history of syncope in the setting of atrial flutter with bradycardia status post pacemaker placement (5/12), diabetes mellitus on insulin, and hypertension who presented to the emergency department with the complaint of loss of consciousness. Patient states he was in his usual state of health while eating at [employer] this morning when he suddenly passed out. He had no preceding symptoms prior to his loss of consciousness.  The next thing he remembers he was in the ambulance. He states that he did need the use the restroom prior to passing out. He has not believe that he had any witnessed seizure activity, loss of bladder or bowel continence. He states that he felt pretty normal after the episode.  Recently, he states he's felt a little unsteady but denies any other focal neurologic changes.  He did have a similar episode in 5/12 when he was sitting in his car and suddenly lost consciousness. At that time, his sugar was noted to be 40. This was associated with increased confusion and mental status changes he was admitted to the hospital. He only underwent a pacemaker do to age aflutter with slow ventricular rate. His history is unreliable but he thinks he may have had another episode a few months ago that he did not seek attention for.   In the emergency department head CT showed a small right frontoparietal extra-axial fluid collection suspicious for a 4 mm subacute subdural hematoma. He is on Coumadin but his INR was subtherapeutic. He thinks he may have hit his head about 2 months ago but can't say this for sure. He is on Coumadin for atrial flutter but has been subtherapeutic. Neurosurgery says that his Coumadin does not need to be reversed.  His pacemaker was interrogated and showed no abnormalities. Cardiology has seen him already.  Review of Systems:  Review of  Systems -all systems negative except in history of present illness with the following exceptions: Left knee pain.   Past Medical History: Past Medical History  Diagnosis Date  . DJD (degenerative joint disease)   . Lumbar disc disease   . Glaucoma   . DM (diabetes mellitus)   . Benign prostatic hypertrophy   . Hyperlipidemia   . HTN (hypertension)   . Bradycardia     s/p PPM by Dr Johney Frame  . Atrial flutter     chronically anticoagulated with coumadin  . DJD (degenerative joint disease)   . Pacemaker     Past Surgical History  Procedure Date  . Transurethral resection of prostate   . Hemorrhoid surgery   . Pacemaker insertion 5/12    by Dr Johney Frame - [employer] Jude device  . Insert / replace / remove pacemaker      Medications: Prior to Admission medications   Medication Sig Start Date End Date Taking? Authorizing Provider  atorvastatin (LIPITOR) 20 MG tablet Take 20 mg by mouth daily.     Yes Historical Provider, MD  bimatoprost (LUMIGAN) 0.03 % ophthalmic solution Place 1 drop into both eyes at bedtime.     Yes Historical Provider, MD  carvedilol (COREG) 12.5 MG tablet TAKE 1 TABLET BY MOUTH TWICE DAILY WITH MEALS 08/05/11  Yes Dolores Patty, MD  digoxin (LANOXIN) 0.25 MG tablet Take 0.125 mg by mouth daily.     Yes Historical Provider, MD  dorzolamide (TRUSOPT) 2 % ophthalmic solution Place 1 drop  into both eyes 3 (three) times daily.     Yes Historical Provider, MD  insulin NPH-insulin regular (NOVOLIN 70/30) (70-30) 100 UNIT/ML injection Inject 15-20 Units into the skin 2 (two) times daily with a meal. 20 units in the morning and 15 units in the afternoon   Yes Historical Provider, MD  losartan-hydrochlorothiazide (HYZAAR) 100-25 MG per tablet Take 1 tablet by mouth daily.     Yes Historical Provider, MD  warfarin (COUMADIN) 5 MG tablet Take 5 mg by mouth daily. Take 1 tablet (=5mg )  daily except take 1.5 (=7.5mg ) on Sunday, Tuesday, thursday   Yes Historical Provider, MD     Allergies:  No Known Allergies   Social History (reviewed - no changes required): Widowed with 1 child (Youthful Man!) Occupation: Former Barrister's clerk Tobacco: quit 35 years ago; 1.5ppd x 30 years Alcohol: none  Family History (reviewed - no changes required): Father: ? DM on Dad and Mom's side Mother: died 47: blindness Grandparents: ? aunts with DM Sibling: only child Children: 1 son healthy  Physical Exam: Filed Vitals:   12/24/11 1800 12/24/11 1803 12/24/11 1806 12/24/11 1810  BP: 190/89 189/86 166/79 182/82  Pulse: 60 59 60 57  Temp:      TempSrc:      Resp:      Height:      Weight:      SpO2: 100% 100% 100% 100%   General appearance: alert and Appears younger than stated age Head: Normocephalic, without obvious abnormality, atraumatic Eyes: conjunctivae/corneas clear. PERRL, EOM's intact.  Nose: Nares normal. Septum midline. Mucosa normal. No drainage or sinus tenderness. Throat: lips, mucosa, and tongue normal; teeth and gums normal Neck: no adenopathy, no carotid bruit, no JVD and thyroid not enlarged, symmetric, no tenderness/mass/nodules Resp: clear to auscultation bilaterally Cardio: regular rate and rhythm, S1, S2 normal, no murmur, click, rub or gallop GI: soft, non-tender; bowel sounds normal; no masses,  no organomegaly Extremities: extremities normal, atraumatic, no cyanosis or edema Pulses: 2+ and symmetric Lymph nodes: Cervical adenopathy: no cervical lymphadenopathy Neurologic: Alert and oriented X 3, normal strength and tone. Normal symmetric reflexes. Finger-nose is intact. Gait is normal for age.   Labs on Admission:   [facility] 12/24/11 1219  NA 141  K 3.9  CL 106  CO2 24  GLUCOSE 139*  BUN 25*  CREATININE 1.58*  CALCIUM 9.1  MG --  PHOS --    Basename 12/24/11 1219  AST 21  ALT 8  ALKPHOS 90  BILITOT 0.4  PROT 7.4  ALBUMIN 3.5   No results found for this basename: LIPASE:2,AMYLASE:2 in the last 72 hours  Basename  12/24/11 1219  WBC 4.4  NEUTROABS 3.0  HGB 13.3  HCT 39.9  MCV 82.8  PLT 125*    Basename 12/24/11 1420 12/24/11 1219  CKTOTAL -- --  CKMB -- --  CKMBINDEX -- --  TROPONINI <0.30 <0.30   Lab Results  Component Value Date   INR 1.17 12/24/2011   INR 1.2 11/29/2011   INR 2.7 11/01/2011    Radiological Exams on Admission: Dg Chest 2 View  12/24/2011  *RADIOLOGY REPORT*  Clinical Data: .  Syncopal episode  CHEST - 2 VIEW  Comparison: 02/17/2011  Findings: The lungs are clear without focal infiltrate, edema, pneumothorax or pleural effusion. Interstitial markings are diffusely coarsened with chronic features. The cardiopericardial silhouette is within normal limits for size.  Left-sided dual lead permanent pacemaker remains in place. Imaged bony structures of the thorax are intact.  Mild compression deformity of a mid thoracic vertebral body is stable since 11/04/2010.  IMPRESSION: Stable exam.  No acute findings.  Original Report Authenticated By: ERIC A. MANSELL, M.D.   Ct Head Wo Contrast  12/24/2011  *RADIOLOGY REPORT*  Clinical Data: Syncopal event.  On Coumadin.  Hypertension. Diabetes mellitus.  Cardiac arrhythmia.  CT HEAD WITHOUT CONTRAST  Technique:  Contiguous axial images were obtained from the base of the skull through the vertex without contrast.  Comparison: None.  Findings: There is no evidence for acute infarction, acute intra- axial hemorrhage, mass lesion, or hydrocephalus.  Small 4 mm right frontal and right parietal extra-axial fluid collection of low to intermediate attenuation suggestive of subacute subdural hematoma. This was not present on previous study from 2012.  There is no significant mass effect on the underlying brain or midline shift.  Moderate atrophy is present.  Chronic microvascular ischemic changes are noted.  The calvarium is intact.  Vascular calcification is present.  There is no sinus or mastoid disease.  IMPRESSION: Right frontoparietal extra-axial fluid  collection, low to intermediate attenuation, suspect 4 mm subacute subdural hematoma. This was not present on previous CT 02/17/2011.  Atrophy with chronic microvascular ischemic change is stable.  Original Report Authenticated By: Elsie Stain, M.D.   Orders placed during the hospital encounter of 12/24/11  . EKG 12-LEAD  . EKG 12-LEAD    Assessment/Plan Principal Problem: 1. *Syncope- sudden onset of syncope without any preceding symptoms is concerning for underlying cardiac rhythm disturbance; however, his pacemaker interrogation does not show any tachyarrhythmia or events. We'll monitor on telemetry.  There is no evidence of this was a seizure although with his subdural hematoma this is a consideration. No evidence of focal neurologic changes to suggest TIA or CVA. Will proceed with a repeat echocardiogram and carotid Dopplers as it appears that these were not done since 5/12 known absence of carotid bruits or cardiac murmur these are unlikely to be abnormal.   Active Problems: 2. Subdural hematoma-appears subacute by CT appearance. We'll repeat CT of the head and 48 hours to verify stability.  Will hold Coumadin pending this evaluation. Neurosurgery feels that this is nonoperative recommends conservative management. 3. DM- we'll use Lantus insulin scale insulin while an inpatient and resume his home insulin regimen on discharge. 4. HYPERLIPIDEMIA- continue Lipitor. 5. HYPERTENSION- blood pressure is markedly elevated. Will add amlodipine for better blood pressure control. Continue losartan HCT. 6. Atrial flutter but it does not appear that he has been taking his digoxin and I wonder if he's been compliant with his Coumadin as well given the subtherapeutic INR. 7. Generalized weakness-this may be related to his subdural. Will obtain a PT/OT consult. No focal neurologic changes. 8. Disposition- anticipate discharge in 2-3 days pending evaluation if subdural hematoma is stable.   Mario Warner,Mario  Warner 12/24/2011, 6:41 PM

## 2011-12-24 NOTE — ED Notes (Signed)
Pt is currently in radiology. 

## 2011-12-24 NOTE — ED Notes (Signed)
Patient at a [employer] period of unresponsiveness witnessed EMS called. Patient ax4 states general weakness for 2 days.  EMS ekg paced IV left AC 20g. And emesis x1 with fire department on scene.

## 2011-12-24 NOTE — Progress Notes (Signed)
Patient's syncope not related to rule out stroke per Dr. Clelia Croft, no need to do stroke protocal.  Will continue to monitor.  Mario Warner

## 2011-12-24 NOTE — ED Provider Notes (Signed)
History     CSN: [id number]  Arrival date & time 12/24/11  1114   First MD Initiated Contact with Patient 12/24/11 1147      Chief Complaint  Patient presents with  . Fatigue    (Consider location/radiation/quality/duration/timing/severity/associated sxs/prior treatment) HPI Hx from pt. 78 yo M with PMH DM, HTN, HLD, cardiac arrhythmia with pacer in situ, who presents after a syncopal event. He was eating breakfast at [employer] this morning. He recalls needing to use the bathroom. He apparently had a syncopal event while in the bathroom. He recalls feeling the room spinning around him for a second or 2 and then has no memory of any event until he woke up in the ambulance. He does not recall having any preceding headache, visual change, nausea, vomiting, chest pain, abdominal pain.   He had a similar episode last year and was called a code stroke. He was found to be hypoglycemic and was in an arrhythmia. He was hospitalized and had a pacer inserted.  PCP: Dr. Wylene Simmer Cardiologist: Dr. Patty Sermons EP cardiologist: Dr. Johney Frame  Past Medical History  Diagnosis Date  . DJD (degenerative joint disease)   . Lumbar disc disease   . Glaucoma   . DM (diabetes mellitus)   . Benign prostatic hypertrophy   . Hyperlipidemia   . HTN (hypertension)   . Bradycardia     s/p PPM by Dr Johney Frame  . Atrial flutter     chronically anticoagulated with coumadin    Past Surgical History  Procedure Date  . Transurethral resection of prostate   . Hemorrhoid surgery   . Pacemaker insertion 5/12    by Dr Johney Frame - [employer] device    Family History  Problem Relation Age of Onset  . Diabetes Mother     History  Substance Use Topics  . Smoking status: Former Smoker    Quit date: 02/05/1971  . Smokeless tobacco: Not on file  . Alcohol Use: No      Review of Systems  Constitutional: Negative for fever and chills.  Respiratory: Negative for cough, chest tightness and shortness of breath.     Cardiovascular: Negative for chest pain and palpitations.  Gastrointestinal: Negative for nausea, vomiting and abdominal pain.  Musculoskeletal: Negative for myalgias.  Skin: Negative for color change and rash.  Neurological: Positive for syncope. Negative for dizziness and weakness.  All other systems reviewed and are negative.    Allergies  Review of patient's allergies indicates no known allergies.  Home Medications   Current Outpatient Rx  Name Route Sig Dispense Refill  . ATORVASTATIN CALCIUM 20 MG PO TABS Oral Take 20 mg by mouth daily.      Marland Kitchen BIMATOPROST 0.03 % OP SOLN Both Eyes Place 1 drop into both eyes at bedtime.      Marland Kitchen CARVEDILOL 12.5 MG PO TABS  TAKE 1 TABLET BY MOUTH TWICE DAILY WITH MEALS 60 tablet 6  . DIGOXIN 0.25 MG PO TABS Oral Take 0.125 mg by mouth daily.      . DORZOLAMIDE HCL 2 % OP SOLN Both Eyes Place 1 drop into both eyes 3 (three) times daily.      . INSULIN ISOPHANE & REGULAR (70-30) 100 UNIT/ML [place] SUSP Subcutaneous Inject 15-20 Units into the skin 2 (two) times daily with a meal. 20 units in the morning and 15 units in the afternoon    . LOSARTAN POTASSIUM-HCTZ 100-25 MG PO TABS Oral Take 1 tablet by mouth daily.      Marland Kitchen  WARFARIN SODIUM 5 MG PO TABS Oral Take 5 mg by mouth daily. Take 1 tablet (=5mg )  daily except take 1.5 (=7.5mg ) on Sunday, Tuesday, thursday      BP 189/120  Pulse 55  Temp 97.7 F (36.5 C) (Oral)  Resp 23  SpO2 100%  Physical Exam  Nursing note and vitals reviewed. Constitutional: He is oriented to person, place, and time. He appears well-developed and well-nourished. No distress.  HENT:  Head: Normocephalic and atraumatic.  Eyes: EOM are normal. Pupils are equal, round, and reactive to light.  Neck: Normal range of motion.  Cardiovascular: Normal rate, regular rhythm and normal heart sounds.   Pulmonary/Chest: Effort normal and breath sounds normal. He exhibits no tenderness.  Abdominal: Soft. Bowel sounds are normal. There  is no tenderness. There is no rebound and no guarding.  Musculoskeletal: Normal range of motion.  Neurological: He is alert and oriented to person, place, and time. No cranial nerve deficit. He exhibits normal muscle tone. Coordination normal. GCS eye subscore is 4. GCS verbal subscore is 5. GCS motor subscore is 6.  Skin: Skin is warm and dry. He is not diaphoretic.  Psychiatric: He has a normal mood and affect.    ED Course  Procedures (including critical care time)  Labs Reviewed  CBC WITH DIFFERENTIAL - Abnormal; Notable for the following:    Platelets 125 (*)     All other components within normal limits  COMPREHENSIVE METABOLIC PANEL - Abnormal; Notable for the following:    Glucose, Bld 139 (*)     BUN 25 (*)     Creatinine, Ser 1.58 (*)     GFR calc non Af Amer 36 (*)     GFR calc Af Amer 42 (*)     All other components within normal limits  GLUCOSE, CAPILLARY - Abnormal; Notable for the following:    Glucose-Capillary 121 (*)     All other components within normal limits  DIGOXIN LEVEL - Abnormal; Notable for the following:    Digoxin Level <0.3 (*)     All other components within normal limits  TROPONIN I  PROTIME-INR  APTT  URINALYSIS, ROUTINE W REFLEX MICROSCOPIC   Dg Chest 2 View  12/24/2011  *RADIOLOGY REPORT*  Clinical Data: .  Syncopal episode  CHEST - 2 VIEW  Comparison: 02/17/2011  Findings: The lungs are clear without focal infiltrate, edema, pneumothorax or pleural effusion. Interstitial markings are diffusely coarsened with chronic features. The cardiopericardial silhouette is within normal limits for size.  Left-sided dual lead permanent pacemaker remains in place. Imaged bony structures of the thorax are intact.  Mild compression deformity of a mid thoracic vertebral body is stable since 11/04/2010.  IMPRESSION: Stable exam.  No acute findings.  Original Report Authenticated By: ERIC A. MANSELL, M.D.   Ct Head Wo Contrast  12/24/2011  *RADIOLOGY REPORT*   Clinical Data: Syncopal event.  On Coumadin.  Hypertension. Diabetes mellitus.  Cardiac arrhythmia.  CT HEAD WITHOUT CONTRAST  Technique:  Contiguous axial images were obtained from the base of the skull through the vertex without contrast.  Comparison: None.  Findings: There is no evidence for acute infarction, acute intra- axial hemorrhage, mass lesion, or hydrocephalus.  Small 4 mm right frontal and right parietal extra-axial fluid collection of low to intermediate attenuation suggestive of subacute subdural hematoma. This was not present on previous study from 2012.  There is no significant mass effect on the underlying brain or midline shift.  Moderate atrophy is present.  Chronic microvascular ischemic changes are noted.  The calvarium is intact.  Vascular calcification is present.  There is no sinus or mastoid disease.  IMPRESSION: Right frontoparietal extra-axial fluid collection, low to intermediate attenuation, suspect 4 mm subacute subdural hematoma. This was not present on previous CT 02/17/2011.  Atrophy with chronic microvascular ischemic change is stable.  Original Report Authenticated By: Elsie Stain, M.D.     1. Subacute subdural hematoma   2. Syncope       MDM  Patient presents after syncopal episode. He has no recollection of presyncopal symptoms. He additionally reports feeling generally weak for the past 2 days. He does have a pacer in place. Lab and imaging investigation significant for subacute subdural hematoma on the right side which I discussed with the radiologist. Dr. Ethelda Chick discussed with Dr. Danielle Dess of neurosurgery who reviewed the imaging. He states that there is no need for further intervention at this time and the patient's Coumadin does not need to be reversed. He is currently subtherapeutic on his Coumadin with an INR of 1.17. Remainder of lab investigation largely unremarkable with the exception of a subtherapeutic digoxin level. Pt awaiting interrogation of pacer  by [employer]. I discussed the case with Dr. Clelia Croft, on call for pt's PCP, Dr. Wylene Simmer. He agrees to admit the patient to telemetry. Requests cardiology consult - I discussed with Trish with [facility] who will have EP consult.  Grant Fontana, PA-C 12/24/11 1629

## 2011-12-24 NOTE — ED Notes (Signed)
Cardiology in to see patient.

## 2011-12-24 NOTE — ED Provider Notes (Addendum)
Patient had syncopal event while at [employer] this morning. Patient now asymptomatic. On exam alert no distress lungs clear auscultation heart bradycardic regular rhythm abdomen nontender extremities without edema  Doug Sou, MD 12/24/11 1247 Spoke with Dr. Danielle Dess regarding subdural hematoma. He reviewed scan no further intervention needed Coumadin does not need to be reversed.  Doug Sou, MD 12/24/11 1415

## 2011-12-24 NOTE — ED Provider Notes (Signed)
Medical screening examination/treatment/procedure(s) were conducted as a shared visit with non-physician practitioner(s) and myself.  I personally evaluated the patient during the encounter  Doug Sou, MD 12/24/11 1729

## 2011-12-24 NOTE — Consult Note (Signed)
CARDIOLOGY CONSULT NOTE  Patient ID: Mario Warner MRN: [id number] DOB/AGE: 1917-05-06 78 y.o.  Admit date: 12/24/2011 Primary Physician   Gaspar Garbe, MD Primary Cardiologist   Dr. Allred/Dr. Patty Sermons Chief Complaint    Syncope  HPI:  The patient presents after an episode of syncope. I saw him in the past following loss of consciousness. At that time he had tachybradycardia syndrome with atrial flutter. He has subsequently had a pacemaker placed. At the end of that hospitalization in May he did have an event that was thought possibly to be a TIA. I was able to find the results of a carotid Doppler done at that time which demonstrated no disease. He had no significant abnormalities on echo. He had been doing well.  Today he was at [facility]. He remembers having to go to the bathroom. To me denies any symptoms such as dizziness, palpitations, presyncope, chest discomfort, neck or arm discomfort. He apparently had a syncopal episode in the bathroom. There was no mention of loss of bowel or bladder. He didn't have any trauma. There is no witness report available but it's unlikely there was any seizure. In the emergency room his EKG shows flutter with a paced rhythm. He did have pacemaker interrogation which demonstrates normal function. Head CT does demonstrate a small subdural hematoma. It's unclear whether this is acute or subacute. He doesn't have any obvious trauma to his head. He actually has no evidence of trauma that I can find on exam.  He says otherwise he had been feeling well though he has had some mild orthostatic symptoms recently and he says his gait has been off for a couple of weeks. He's had no other acute neurologic complaints. We are asked to consult regarding his syncope.  Past Medical History  Diagnosis Date  . DJD (degenerative joint disease)   . Lumbar disc disease   . Glaucoma   . DM (diabetes mellitus)   . Benign prostatic hypertrophy   . Hyperlipidemia   . HTN  (hypertension)   . Bradycardia     s/p PPM by Dr Johney Frame  . Atrial flutter     chronically anticoagulated with coumadin    Past Surgical History  Procedure Date  . Transurethral resection of prostate   . Hemorrhoid surgery   . Pacemaker insertion 5/12    by Dr Johney Frame - [employer] device    No Known Allergies No current facility-administered medications on file prior to encounter.   Current Outpatient Prescriptions on File Prior to Encounter  Medication Sig Dispense Refill  . atorvastatin (LIPITOR) 20 MG tablet Take 20 mg by mouth daily.        . bimatoprost (LUMIGAN) 0.03 % ophthalmic solution Place 1 drop into both eyes at bedtime.         Warfarin  As directed    . carvedilol (COREG) 12.5 MG tablet TAKE 1 TABLET BY MOUTH TWICE DAILY WITH MEALS  60 tablet  6  . digoxin (LANOXIN) 0.25 MG tablet Take 0.125 mg by mouth daily.        . dorzolamide (TRUSOPT) 2 % ophthalmic solution Place 1 drop into both eyes 3 (three) times daily.        . insulin NPH-insulin regular (NOVOLIN 70/30) (70-30) 100 UNIT/ML injection Inject 15-20 Units into the skin 2 (two) times daily with a meal. 20 units in the morning and 15 units in the afternoon      . losartan-hydrochlorothiazide (HYZAAR) 100-25 MG per tablet Take 1 tablet  by mouth daily.          History   Social History  . Marital Status: Widowed    Spouse Name: N/A    Number of Children: 1  . Years of Education: N/A   Occupational History  . Not on file.   Social History Main Topics  . Smoking status: Former Smoker    Quit date: 02/05/1971  . Smokeless tobacco: Not on file  . Alcohol Use: No  . Drug Use: No  . Sexually Active: Not on file   Other Topics Concern  . Not on file   Social History Narrative   Lives alone.      Family History:  No history of CAD, CHF or other pertinent history.  ROS:  As stated in the HPI and negative for all other systems.  Physical Exam: Blood pressure 189/120, pulse 55, temperature 97.7 F (36.5  C), temperature source Oral, resp. rate 23, SpO2 100.00%.  GENERAL:  Well appearing, looks much younger than stated age HEENT:  Pupils equal round and reactive, fundi not visualized, oral mucosa unremarkable, dentures NECK:  No jugular venous distention, waveform within normal limits, carotid upstroke brisk and symmetric, left bruit, no thyromegaly LYMPHATICS:  No cervical, inguinal adenopathy LUNGS:  Clear to auscultation bilaterally BACK:  No CVA tenderness CHEST:  Unremarkable HEART:  PMI not displaced or sustained,S1 and S2 within normal limits, no S3, no S4, no clicks, no rubs, no murmurs ABD:  Flat, positive bowel sounds normal in frequency in pitch, no bruits, no rebound, no guarding, no midline pulsatile mass, no hepatomegaly, no splenomegaly EXT:  2 plus pulses upper, decreased DP/PT bilateral lower, no edema, no cyanosis no clubbing SKIN:  No rashes no nodules NEURO:  Cranial nerves II through XII grossly intact, motor grossly intact throughout PSYCH:  Cognitively intact, oriented to person place and time  Labs: Lab Results  Component Value Date   BUN 25* 12/24/2011   Lab Results  Component Value Date   CREATININE 1.58* 12/24/2011   Lab Results  Component Value Date   NA 141 12/24/2011   K 3.9 12/24/2011   CL 106 12/24/2011   CO2 24 12/24/2011    Lab Results  Component Value Date   WBC 4.4 12/24/2011   HGB 13.3 12/24/2011   HCT 39.9 12/24/2011   MCV 82.8 12/24/2011   PLT 125* 12/24/2011    Lab Results  Component Value Date   ALT 8 12/24/2011   AST 21 12/24/2011   ALKPHOS 90 12/24/2011   BILITOT 0.4 12/24/2011      Radiology:  CXR:  Findings: The lungs are clear without focal infiltrate, edema, pneumothorax or pleural effusion. Interstitial markings are diffusely coarsened with chronic features. The cardiopericardial silhouette is within normal limits for size. Left-sided dual lead permanent pacemaker remains in place. Imaged bony structures of the thorax are intact.  Mild compression deformity of a mid thoracic vertebral body is stable since 11/04/2010.   CT Head:  Right frontoparietal extra-axial fluid collection, low to intermediate attenuation, suspect 4 mm subacute subdural hematoma. This was not present on previous CT 02/17/2011. Atrophy with chronic microvascular ischemic change is stable.   EKG:  Atrial flutter with ventricular pacing rate 50.  ASSESSMENT AND PLAN:    Syncope:  The etiology of this is not clear. He is normally functioning pacemaker. I have asked the ER to check orthostatics. There is no suggestion that this was an acute ischemic event. He's had normal Dopplers and a relatively  unremarkable echo within the last few months. It is interesting that he has a subdural hematoma without evidence of acute trauma. He's also had an abnormal gait. I wonder if this could be related but I will defer to his primary providers.  Atrial flutter:  He was subtherapeutic and his last Coumadin check on June 17. He was supposed to come back in 10 days but he forgot his appointment. He was subtherapeutic today. We need to clarify with neurosurgery whether he can continue his warfarin despite the bleed very  Tachybrady s/p permanent pacemaker: He has normal pacemaker function.  CKD:  Creat is stable.    HTN:  His blood pressure is  Elevated in the ER.  He says it has not been elevated recently.  It was not elevated at his last office appointment..   I would suggest treatment with his usual oral medications and hydralazine IV prn.   SignedRollene Rotunda 12/24/2011, 4:47 PM

## 2011-12-25 DIAGNOSIS — I369 Nonrheumatic tricuspid valve disorder, unspecified: Secondary | ICD-10-CM

## 2011-12-25 DIAGNOSIS — R55 Syncope and collapse: Secondary | ICD-10-CM

## 2011-12-25 LAB — CARDIAC PANEL(CRET KIN+CKTOT+MB+TROPI)
Relative Index: INVALID (ref 0.0–2.5)
Total CK: 90 U/L (ref 7–232)
Total CK: 83 U/L (ref 7–232)

## 2011-12-25 LAB — BASIC METABOLIC PANEL
BUN: 19 mg/dL (ref 6–23)
Calcium: 8.9 mg/dL (ref 8.4–10.5)
Creatinine, Ser: 1.45 mg/dL — ABNORMAL HIGH (ref 0.50–1.35)
GFR calc Af Amer: 46 mL/min — ABNORMAL LOW (ref >90)
GFR calc non Af Amer: 40 mL/min — ABNORMAL LOW (ref >90)
Potassium: 3.9 mEq/L (ref 3.5–5.1)

## 2011-12-25 LAB — GLUCOSE, CAPILLARY
Glucose-Capillary: 76 mg/dL (ref 70–99)
Glucose-Capillary: 144 mg/dL — ABNORMAL HIGH (ref 70–99)
Glucose-Capillary: 132 mg/dL — ABNORMAL HIGH (ref 70–99)
Glucose-Capillary: 88 mg/dL (ref 70–99)
Glucose-Capillary: 127 mg/dL — ABNORMAL HIGH (ref 70–99)

## 2011-12-25 LAB — CBC
HCT: 34.6 % — ABNORMAL LOW (ref 39.0–52.0)
MCH: 28 pg (ref 26.0–34.0)
MCH: 28.2 pg (ref 26.0–34.0)
MCHC: 34.1 g/dL (ref 30.0–36.0)
MCHC: 34.5 g/dL (ref 30.0–36.0)
MCV: 82 fL (ref 78.0–100.0)
MCV: 81.7 fL (ref 78.0–100.0)
Platelets: 108 10*3/uL — ABNORMAL LOW (ref 150–400)
RDW: 13.9 % (ref 11.5–15.5)

## 2011-12-25 LAB — PROTIME-INR: INR: 1.19 (ref 0.00–1.49)

## 2011-12-25 LAB — HEMOGLOBIN A1C: Hgb A1c MFr Bld: 6.4 % — ABNORMAL HIGH (ref <5.7)

## 2011-12-25 MED ORDER — INSULIN GLARGINE 100 UNIT/ML ~~LOC~~ SOLN
10.0000 [IU] | Freq: Every day | SUBCUTANEOUS | Status: DC
  Administered 2011-12-25 – 2011-12-26 (×2): 10 [IU] via SUBCUTANEOUS

## 2011-12-25 NOTE — Progress Notes (Signed)
Patient ID: Mario Warner, male   DOB: 06-Oct-1916, 78 y.o.   MRN: [id number]    Subjective:  Denies SSCP, palpitations or Dyspnea   Objective:  Filed Vitals:   12/24/11 1810 12/24/11 2100 12/24/11 2300 12/25/11 0500  BP: 182/82 187/90 178/71 149/80  Pulse: 57 58 60 61  Temp:  98.3 F (36.8 C)  98.2 F (36.8 C)  TempSrc:  Oral  Oral  Resp:  20  20  Height:      Weight:      SpO2: 100% 100%  100%    Intake/Output from previous day:  Intake/Output Summary (Last 24 hours) at 12/25/11 0917 Last data filed at 12/25/11 0500  Gross per 24 hour  Intake      0 ml  Output    125 ml  Net   -125 ml    Physical Exam: Affect appropriate Healthy:  appears stated age HEENT: normal Neck supple with no adenopathy JVP normal no bruits no thyromegaly Lungs clear with no wheezing and good diaphragmatic motion Heart:  S1/S2 no murmur, no rub, gallop or click PMI normal Abdomen: benighn, BS positve, no tenderness, no AAA no bruit.  No HSM or HJR Distal pulses intact with no bruits No edema Neuro non-focal Skin warm and dry No muscular weakness   Lab Results: Basic Metabolic Panel:  Basename 12/25/11 0321 12/24/11 1219  NA 141 141  K 3.9 3.9  CL 109 106  CO2 24 24  GLUCOSE 46* 139*  BUN 19 25*  CREATININE 1.45* 1.58*  CALCIUM 8.9 9.1  MG -- --  PHOS -- --   Liver Function Tests:  [facility] 12/24/11 1219  AST 21  ALT 8  ALKPHOS 90  BILITOT 0.4  PROT 7.4  ALBUMIN 3.5   No results found for this basename: LIPASE:2,AMYLASE:2 in the last 72 hours CBC:  Basename 12/25/11 0321 12/24/11 1219  WBC 4.2 4.4  NEUTROABS -- 3.0  HGB 11.8* 13.3  HCT 34.6* 39.9  MCV 82.0 82.8  PLT 111* 125*   Cardiac Enzymes:  Basename 12/25/11 0317 12/24/11 2035 12/24/11 1420  CKTOTAL 90 103 --  CKMB 1.9 2.4 --  CKMBINDEX -- -- --  TROPONINI <0.30 <0.30 <0.30   BNP: No components found with this basename: POCBNP:3 D-Dimer: No results found for this basename: DDIMER:2 in the last  72 hours Hemoglobin A1C:  Basename 12/24/11 2035  HGBA1C 6.4*    Imaging: Dg Chest 2 View  12/24/2011  *RADIOLOGY REPORT*  Clinical Data: .  Syncopal episode  CHEST - 2 VIEW  Comparison: 02/17/2011  Findings: The lungs are clear without focal infiltrate, edema, pneumothorax or pleural effusion. Interstitial markings are diffusely coarsened with chronic features. The cardiopericardial silhouette is within normal limits for size.  Left-sided dual lead permanent pacemaker remains in place. Imaged bony structures of the thorax are intact.  Mild compression deformity of a mid thoracic vertebral body is stable since 11/04/2010.  IMPRESSION: Stable exam.  No acute findings.  Original Report Authenticated By: ERIC A. MANSELL, M.D.   Ct Head Wo Contrast  12/24/2011  *RADIOLOGY REPORT*  Clinical Data: Syncopal event.  On Coumadin.  Hypertension. Diabetes mellitus.  Cardiac arrhythmia.  CT HEAD WITHOUT CONTRAST  Technique:  Contiguous axial images were obtained from the base of the skull through the vertex without contrast.  Comparison: None.  Findings: There is no evidence for acute infarction, acute intra- axial hemorrhage, mass lesion, or hydrocephalus.  Small 4 mm right frontal and right parietal extra-axial fluid  collection of low to intermediate attenuation suggestive of subacute subdural hematoma. This was not present on previous study from 2012.  There is no significant mass effect on the underlying brain or midline shift.  Moderate atrophy is present.  Chronic microvascular ischemic changes are noted.  The calvarium is intact.  Vascular calcification is present.  There is no sinus or mastoid disease.  IMPRESSION: Right frontoparietal extra-axial fluid collection, low to intermediate attenuation, suspect 4 mm subacute subdural hematoma. This was not present on previous CT 02/17/2011.  Atrophy with chronic microvascular ischemic change is stable.  Original Report Authenticated By: Elsie Stain, M.D.     Cardiac Studies:  ECG:  afib with V pacing LBBB morphology   Telemetry: afib underlying rhythm with v pacing   Echo: pending  Medications:     . sodium chloride   Intravenous STAT  . amLODipine  5 mg Oral Daily  . atorvastatin  20 mg Oral Daily  . carvedilol  12.5 mg Oral BID WC  . digoxin  0.125 mg Oral Daily  . losartan  100 mg Oral Daily   And  . hydrochlorothiazide  25 mg Oral Daily  . insulin aspart  0-15 Units Subcutaneous TID WC  . insulin aspart  0-5 Units Subcutaneous QHS  . insulin glargine  10 Units Subcutaneous QHS  . sodium chloride  3 mL Intravenous Q12H  . sodium chloride  3 mL Intravenous Q12H  . DISCONTD: sodium chloride   Intravenous STAT  . DISCONTD: bimatoprost  1 drop Both Eyes QHS  . DISCONTD: carvedilol  12.5 mg Oral BID WC  . DISCONTD: dorzolamide  1 drop Both Eyes TID  . DISCONTD: insulin glargine  20 Units Subcutaneous QHS  . DISCONTD: losartan-hydrochlorothiazide  1 tablet Oral Daily       Assessment/Plan:  Syncope:  See note by [facility].  ? Low BS somewhat low again this am.  No obvious cardiac etiology.  Echo and carotid Pending not sure they will get done over weekend.  I would be a little concerned about coumadin in 78 yo who is  Having "syncope" without obvious etiology and old subdural.   HTN:  Certainly not postural consider increasing norvasc if BP stays high  ? D/C am  Charlton Haws 12/25/2011, 9:17 AM

## 2011-12-25 NOTE — Progress Notes (Signed)
VASCULAR LAB PRELIMINARY  PRELIMINARY  PRELIMINARY  PRELIMINARY  Carotid Dopplers completed.    Preliminary report:  No significant ICA stenosis.  Vertebral artery flow is antegrade.  Shatia Sindoni, 12/25/2011, 10:36 AM

## 2011-12-25 NOTE — Progress Notes (Signed)
CBG: 49  Treatment: 15 GM carbohydrate snack  Symptoms: None  Follow-up CBG: Time: 0551 CBG Result:77  Possible Reasons for Event: Unknown  Comments/MD notified: Pt asymptomatic, CBG on subsequent check. Will continue to monitor.     Zafra, Kasiya Burck [employer]

## 2011-12-25 NOTE — Evaluation (Signed)
Physical Therapy Evaluation Patient Details Name: Mario Warner MRN: [id number] DOB: 03/11/1917 Today's Date: 12/25/2011 Time: 4098-1191 PT Time Calculation (min): 17 min  PT Assessment / Plan / Recommendation Clinical Impression  Patient is a 78 yo male admitted following a syncopal episode.  Patient was found to have a subdural hemorrhage.  Noted slight decrease in balance during gait.  Encouraged patient to use his cane at home.  Recommend HHPT at discharge for continued balance therapy.  Will follow acutely for mobility/gait training, balance therapy, and education.    PT Assessment  Patient needs continued PT services    Follow Up Recommendations  Home health PT;Supervision - Intermittent    Barriers to Discharge Decreased caregiver support      Equipment Recommendations  None recommended by PT    Recommendations for Other Services     Frequency Min 4X/week    Precautions / Restrictions Precautions Precautions: Fall Restrictions Weight Bearing Restrictions: No         Mobility  Bed Mobility Bed Mobility: Supine to Sit;Sitting - Scoot to Edge of Bed Supine to Sit: 7: Independent Sitting - Scoot to Edge of Bed: 7: Independent Details for Bed Mobility Assistance: No cues or assistance needed. Transfers Transfers: Sit to Stand;Stand to Sit Sit to Stand: 5: Supervision;With upper extremity assist;From bed Stand to Sit: 5: Supervision;With upper extremity assist;With armrests;To chair/3-in-1 Details for Transfer Assistance: Cues for safe hand placement.  Supervision due to slightly decreased balance. Ambulation/Gait Ambulation/Gait Assistance: 5: Supervision Ambulation Distance (Feet): 200 Feet Assistive device: None;Straight cane Ambulation/Gait Assistance Details: 52' with no assistive device.  Patient with staggering gait, with loss of balance x1.  Was able to self-correct.  Then 100' with straight cane with improved balance.  No loss of balance with cane. Gait  Pattern: Step-through pattern;Ataxic (Slighly ataxic with decreased balance.  Staggering.) Gait velocity: Cues to slow to safe speed.    Exercises     PT Diagnosis: Abnormality of gait;Generalized weakness  PT Problem List: Decreased strength;Decreased balance;Decreased mobility;Decreased knowledge of use of DME PT Treatment Interventions: DME instruction;Gait training;Stair training;Functional mobility training;Balance training;Patient/family education   PT Goals Acute Rehab PT Goals PT Goal Formulation: With patient Time For Goal Achievement: 12/25/11 Potential to Achieve Goals: Good Pt will go Sit to Stand: with modified independence;with upper extremity assist PT Goal: Sit to Stand - Progress: Goal set today Pt will go Stand to Sit: with modified independence;with upper extremity assist PT Goal: Stand to Sit - Progress: Goal set today Pt will Ambulate: >150 feet;with modified independence;with cane PT Goal: Ambulate - Progress: Goal set today Pt will Go Up / Down Stairs: 3-5 stairs;with modified independence;with least restrictive assistive device PT Goal: Up/Down Stairs - Progress: Goal set today Additional Goals Additional Goal #1: Patient will score 20 or above on DGI balance assessment to indicate low fall risk. PT Goal: Additional Goal #1 - Progress: Goal set today  Visit Information  Last PT Received On: 12/25/11 Assistance Needed: +1    Subjective Data  Subjective: I've been staggering on my feet lately. Patient Stated Goal: To go home soon   Prior Functioning  Home Living Lives With: Alone Available Help at Discharge: Family;Available PRN/intermittently Type of Home: House Home Access: Stairs to enter [employer] of Steps: 6 Entrance Stairs-Rails: Right;Left Home Layout: One level Bathroom Shower/Tub: Engineer, manufacturing systems: Standard Home Adaptive Equipment: Straight cane Prior Function Level of Independence: Independent (daughter and  nieces help with meals) Able to Take Stairs?: Yes Driving:  Yes Vocation: Retired Comments: Daughter calls him every day to check on him.  Very active - bowling until 3 weeks ago. Communication Communication: No difficulties    Cognition  Overall Cognitive Status: Appears within functional limits for tasks assessed/performed Arousal/Alertness: Awake/alert Orientation Level: Appears intact for tasks assessed Behavior During Session: [facility] for tasks performed    Extremity/Trunk Assessment Right Upper Extremity Assessment RUE ROM/Strength/Tone: Within functional levels Left Upper Extremity Assessment LUE ROM/Strength/Tone: Within functional levels Right Lower Extremity Assessment RLE ROM/Strength/Tone: WFL for tasks assessed RLE Sensation: WFL - Light Touch Left Lower Extremity Assessment LLE ROM/Strength/Tone: WFL for tasks assessed LLE Sensation: WFL - Light Touch Trunk Assessment Trunk Assessment: Normal   Balance    End of Session PT - End of Session Equipment Utilized During Treatment: Gait belt Activity Tolerance: Patient tolerated treatment well Patient left: in chair;with call bell/phone within reach Nurse Communication: Mobility status  GP     Vena Austria 12/25/2011, 11:52 AM Durenda Hurt. Renaldo Fiddler, [facility] Acute Rehab Services Pager [phone]

## 2011-12-25 NOTE — Progress Notes (Signed)
*  PRELIMINARY RESULTS* Echocardiogram 2D Echocardiogram has been performed.  Mario Warner 12/25/2011, 9:00 AM

## 2011-12-25 NOTE — Progress Notes (Signed)
Subjective: "I feel better"- no new complaints this am.  No lightheadedness, shortness of breath, chest pain or palpitations.  Sugar was low this morning.  He states that he took his 70/30 about 1 hour prior to eating yesterday (before he went to [facility]).  Does not check his sugar and is not aware of any recent hypoglycemia  Objective: Vital signs in last 24 hours: Temp:  [95.7 F (35.4 C)-98.3 F (36.8 C)] 98.2 F (36.8 C) (07/13 0500) Pulse Rate:  [50-102] 61  (07/13 0500) Resp:  [18-23] 20  (07/13 0500) BP: (149-209)/(66-120) 149/80 mmHg (07/13 0500) SpO2:  [99 %-100 %] 100 % (07/13 0500) Weight:  [77.429 kg (170 lb 11.2 oz)] 77.429 kg (170 lb 11.2 oz) (07/12 1717) Weight change:  Last BM Date: 12/24/11  CBG (last 3)   Basename 12/25/11 0551 12/25/11 0502 12/24/11 2037  GLUCAP 77 49* 162*    Intake/Output from previous day: 07/12 0701 - 07/13 0700 In: -  Out: 125 [Urine:125] Intake/Output this shift:    General appearance: alert and no distress Eyes: no scleral icterus Throat: oropharynx moist without erythema Resp: clear to auscultation bilaterally Cardio: regular rate and rhythm and grade 2/6 SEM GI: soft, non-tender; bowel sounds normal; no masses,  no organomegaly Extremities: no clubbing, cyanosis or edema   Lab Results:  [facility] 12/25/11 0321 12/24/11 1219  NA 141 141  K 3.9 3.9  CL 109 106  CO2 24 24  GLUCOSE 46* 139*  BUN 19 25*  CREATININE 1.45* 1.58*  CALCIUM 8.9 9.1  MG -- --  PHOS -- --    Basename 12/24/11 1219  AST 21  ALT 8  ALKPHOS 90  BILITOT 0.4  PROT 7.4  ALBUMIN 3.5    Basename 12/25/11 0321 12/24/11 1219  WBC 4.2 4.4  NEUTROABS -- 3.0  HGB 11.8* 13.3  HCT 34.6* 39.9  MCV 82.0 82.8  PLT 111* 125*   Lab Results  Component Value Date   INR 1.19 12/25/2011   INR 1.17 12/24/2011   INR 1.2 11/29/2011    Basename 12/25/11 0317 12/24/11 2035 12/24/11 1420  CKTOTAL 90 103 --  CKMB 1.9 2.4 --  CKMBINDEX -- -- --    TROPONINI <0.30 <0.30 <0.30   A1C 6.4   Studies/Results: Dg Chest 2 View  12/24/2011  *RADIOLOGY REPORT*  Clinical Data: .  Syncopal episode  CHEST - 2 VIEW  Comparison: 02/17/2011  Findings: The lungs are clear without focal infiltrate, edema, pneumothorax or pleural effusion. Interstitial markings are diffusely coarsened with chronic features. The cardiopericardial silhouette is within normal limits for size.  Left-sided dual lead permanent pacemaker remains in place. Imaged bony structures of the thorax are intact.  Mild compression deformity of a mid thoracic vertebral body is stable since 11/04/2010.  IMPRESSION: Stable exam.  No acute findings.  Original Report Authenticated By: ERIC A. MANSELL, M.D.   Ct Head Wo Contrast  12/24/2011  *RADIOLOGY REPORT*  Clinical Data: Syncopal event.  On Coumadin.  Hypertension. Diabetes mellitus.  Cardiac arrhythmia.  CT HEAD WITHOUT CONTRAST  Technique:  Contiguous axial images were obtained from the base of the skull through the vertex without contrast.  Comparison: None.  Findings: There is no evidence for acute infarction, acute intra- axial hemorrhage, mass lesion, or hydrocephalus.  Small 4 mm right frontal and right parietal extra-axial fluid collection of low to intermediate attenuation suggestive of subacute subdural hematoma. This was not present on previous study from 2012.  There is no significant  mass effect on the underlying brain or midline shift.  Moderate atrophy is present.  Chronic microvascular ischemic changes are noted.  The calvarium is intact.  Vascular calcification is present.  There is no sinus or mastoid disease.  IMPRESSION: Right frontoparietal extra-axial fluid collection, low to intermediate attenuation, suspect 4 mm subacute subdural hematoma. This was not present on previous CT 02/17/2011.  Atrophy with chronic microvascular ischemic change is stable.  Original Report Authenticated By: Elsie Stain, M.D.     Medications:  Scheduled:   . sodium chloride   Intravenous STAT  . amLODipine  5 mg Oral Daily  . atorvastatin  20 mg Oral Daily  . carvedilol  12.5 mg Oral BID WC  . digoxin  0.125 mg Oral Daily  . losartan  100 mg Oral Daily   And  . hydrochlorothiazide  25 mg Oral Daily  . insulin aspart  0-15 Units Subcutaneous TID WC  . insulin aspart  0-5 Units Subcutaneous QHS  . insulin glargine  20 Units Subcutaneous QHS  . sodium chloride  3 mL Intravenous Q12H  . sodium chloride  3 mL Intravenous Q12H  . DISCONTD: sodium chloride   Intravenous STAT  . DISCONTD: bimatoprost  1 drop Both Eyes QHS  . DISCONTD: carvedilol  12.5 mg Oral BID WC  . DISCONTD: dorzolamide  1 drop Both Eyes TID  . DISCONTD: losartan-hydrochlorothiazide  1 tablet Oral Daily   Continuous:   Assessment/Plan: Principal Problem: 1. *Syncope- secondary evaluation with Carotid dopplers, Echo today.  Unable to do MRI due to pacemaker.  This may have been a hypoglycemia event as he took his 70/30 insulin 1 hour prior to eating.  Sugar was normal in ED (130s) but he had eaten at the time of event so it may have recovered by the time it was checked (I cannot locate EMS reports).  Low concern for ischemia and Pacemaker interrogation is reassuring.  Active Problems: 2. Subdural hematoma- will repeat CT tomorrow and if stable consider restarting Coumadin soon.  Doubt that it contributed to syncope. 3. DM2 with hypoglycemia- hypoglycemic this am- will decrease Lantus to 10 units daily and decrease home 70/30. 4. HYPERLIPIDEMIA- continue lipitor 5. HYPERTENSION- Amlodipine added for better BP control.  Continue other home meds. 6. Atrial flutter- s/p pacemaker.  I doubt that he was taking Digoxin given undetectable level.  Coumadin on hold. 7. DVT prophylaxis- SCDs due to SDH. 8. Disposition- possible discharge in 1-2 days if secondary evaluation is negative and Head CT stable.  PT/OT evaluation due to unsteady gait.    LOS: 1 day   SHAW,W  DOUGLAS 12/25/2011, 8:47 AM

## 2011-12-26 ENCOUNTER — Inpatient Hospital Stay (HOSPITAL_COMMUNITY): Payer: No Typology Code available for payment source

## 2011-12-26 DIAGNOSIS — K625 Hemorrhage of anus and rectum: Secondary | ICD-10-CM | POA: Diagnosis not present

## 2011-12-26 HISTORY — DX: Hemorrhage of anus and rectum: K62.5

## 2011-12-26 LAB — CBC
HCT: 35.1 % — ABNORMAL LOW (ref 39.0–52.0)
MCHC: 33.9 g/dL (ref 30.0–36.0)
Platelets: 115 10*3/uL — ABNORMAL LOW (ref 150–400)
RDW: 13.8 % (ref 11.5–15.5)
WBC: 3.9 10*3/uL — ABNORMAL LOW (ref 4.0–10.5)

## 2011-12-26 LAB — BASIC METABOLIC PANEL
BUN: 19 mg/dL (ref 6–23)
Chloride: 104 mEq/L (ref 96–112)
GFR calc Af Amer: 48 mL/min — ABNORMAL LOW (ref >90)
GFR calc non Af Amer: 41 mL/min — ABNORMAL LOW (ref >90)
Potassium: 3.8 mEq/L (ref 3.5–5.1)
Sodium: 137 mEq/L (ref 135–145)

## 2011-12-26 LAB — PROTIME-INR
INR: 1.25 (ref 0.00–1.49)
Prothrombin Time: 16 seconds — ABNORMAL HIGH (ref 11.6–15.2)

## 2011-12-26 LAB — GLUCOSE, CAPILLARY
Glucose-Capillary: 85 mg/dL (ref 70–99)
Glucose-Capillary: 95 mg/dL (ref 70–99)

## 2011-12-26 NOTE — Progress Notes (Signed)
Subjective: Feels better.  Had 2 episodes of BRBPR yesterday- thinks it is hemmorrhoidal.  No abdominal pain.  Does not recall his last colonoscopy.  Objective: Vital signs in last 24 hours: Temp:  [97.6 F (36.4 C)-98.3 F (36.8 C)] 98.3 F (36.8 C) (07/14 0500) Pulse Rate:  [58-121] 60  (07/14 0500) Resp:  [16-20] 16  (07/14 0500) BP: (147-178)/(79-82) 157/82 mmHg (07/14 0500) SpO2:  [98 %-100 %] 100 % (07/14 0500) Weight change:  Last BM Date: 12/25/11  CBG (last 3)   Basename 12/26/11 0728 12/26/11 0425 12/25/11 2017  GLUCAP 95 85 127*    Intake/Output from previous day: 07/13 0701 - 07/14 0700 In: 716 [P.O.:716] Out: 750 [Urine:750] Intake/Output this shift:    General appearance: alert and no distress Eyes: no scleral icterus Throat: oropharynx moist without erythema Resp: clear to auscultation bilaterally Cardio: regular rate and rhythm and grade 2/6 SEM GI: soft, non-tender; bowel sounds normal; no masses,  no organomegaly Extremities: no clubbing, cyanosis or edema   Lab Results:  [facility] 12/26/11 0405 12/25/11 0321  NA 137 141  K 3.8 3.9  CL 104 109  CO2 25 24  GLUCOSE 77 46*  BUN 19 19  CREATININE 1.41* 1.45*  CALCIUM 9.0 8.9  MG -- --  PHOS -- --    Basename 12/24/11 1219  AST 21  ALT 8  ALKPHOS 90  BILITOT 0.4  PROT 7.4  ALBUMIN 3.5    Basename 12/26/11 0405 12/25/11 1529 12/24/11 1219  WBC 3.9* 4.4 --  NEUTROABS -- -- 3.0  HGB 11.9* 12.0* --  HCT 35.1* 34.8* --  MCV 81.6 81.7 --  PLT 115* 108* --   Lab Results  Component Value Date   INR 1.25 12/26/2011   INR 1.19 12/25/2011   INR 1.17 12/24/2011    Basename 12/25/11 0814 12/25/11 0317 12/24/11 2035  CKTOTAL 83 90 103  CKMB 2.0 1.9 2.4  CKMBINDEX -- -- --  TROPONINI <0.30 <0.30 <0.30   No results found for this basename: TSH,T4TOTAL,FREET3,T3FREE,THYROIDAB in the last 72 hours No results found for this basename: VITAMINB12:2,FOLATE:2,FERRITIN:2,TIBC:2,IRON:2,RETICCTPCT:2  in the last 72 hours  Studies/Results: Dg Chest 2 View  12/24/2011  *RADIOLOGY REPORT*  Clinical Data: .  Syncopal episode  CHEST - 2 VIEW  Comparison: 02/17/2011  Findings: The lungs are clear without focal infiltrate, edema, pneumothorax or pleural effusion. Interstitial markings are diffusely coarsened with chronic features. The cardiopericardial silhouette is within normal limits for size.  Left-sided dual lead permanent pacemaker remains in place. Imaged bony structures of the thorax are intact.  Mild compression deformity of a mid thoracic vertebral body is stable since 11/04/2010.  IMPRESSION: Stable exam.  No acute findings.  Original Report Authenticated By: ERIC A. MANSELL, M.D.   Ct Head Wo Contrast  12/24/2011  *RADIOLOGY REPORT*  Clinical Data: Syncopal event.  On Coumadin.  Hypertension. Diabetes mellitus.  Cardiac arrhythmia.  CT HEAD WITHOUT CONTRAST  Technique:  Contiguous axial images were obtained from the base of the skull through the vertex without contrast.  Comparison: None.  Findings: There is no evidence for acute infarction, acute intra- axial hemorrhage, mass lesion, or hydrocephalus.  Small 4 mm right frontal and right parietal extra-axial fluid collection of low to intermediate attenuation suggestive of subacute subdural hematoma. This was not present on previous study from 2012.  There is no significant mass effect on the underlying brain or midline shift.  Moderate atrophy is present.  Chronic microvascular ischemic changes are noted.  The calvarium is intact.  Vascular calcification is present.  There is no sinus or mastoid disease.  IMPRESSION: Right frontoparietal extra-axial fluid collection, low to intermediate attenuation, suspect 4 mm subacute subdural hematoma. This was not present on previous CT 02/17/2011.  Atrophy with chronic microvascular ischemic change is stable.  Original Report Authenticated By: Elsie Stain, M.D.   Echocardiogram (7/13)- - Left ventricle:  Mild septal dyssynergy (pacemaker). The cavity size was normal. Wall thickness was increased in a pattern of mild LVH. The estimated ejection fraction was 60%. - Right ventricle: The cavity size was normal. Pacer wire or catheter noted in right ventricle. Systolic function was normal. - Right atrium: The atrium was mildly dilated. - Pulmonary arteries: PA peak pressure: 35mm Hg (S).  Carotid Dopplers (7/13)- no significant ICA stenosis  Medications: Scheduled:   . sodium chloride   Intravenous STAT  . amLODipine  5 mg Oral Daily  . atorvastatin  20 mg Oral Daily  . carvedilol  12.5 mg Oral BID WC  . digoxin  0.125 mg Oral Daily  . losartan  100 mg Oral Daily   And  . hydrochlorothiazide  25 mg Oral Daily  . insulin aspart  0-15 Units Subcutaneous TID WC  . insulin aspart  0-5 Units Subcutaneous QHS  . insulin glargine  10 Units Subcutaneous QHS  . sodium chloride  3 mL Intravenous Q12H  . sodium chloride  3 mL Intravenous Q12H  . DISCONTD: insulin glargine  20 Units Subcutaneous QHS   Continuous:   Assessment/Plan: Principal Problem:  1. *Syncope- Suspect this was a hypoglycemia event as he took his 70/30 insulin 1 hour prior to eating. Sugar was normal in ED (130s) but he had eaten at the time of event so it may have recovered by the time it was checked (I cannot locate EMS reports). Low concern for ischemia and Pacemaker interrogation is reassuring.  Echo and carotid dopplers negative. Active Problems:  2. Subdural hematoma- will repeat CT today to verify stability.  He recalls that he may have hit his head 2-3 months ago. Doubt that it contributed to syncope.  Hold Coumadin for now given increased risk of complications. 3. DM2 with hypoglycemia- hypoglycemia resolved with decrease in basal insulin.  Will need less insulin at home. 4. Bright Red Blood Per Rectum- monitor Hg X 24 hours.  Consider GI evaluation if persistent or drop in Hg.  May be hemmorrhoidal.  Another reason  to consider stopping coumadin.  4. HYPERLIPIDEMIA- continue lipitor  5. HYPERTENSION- Amlodipine added for better BP control. Continue other home meds. Consider increase dose if remains elevated. 6. Atrial flutter- s/p pacemaker. I doubt that he was taking Digoxin given undetectable level. Coumadin on hold.  7. DVT prophylaxis- SCDs due to SDH.  8. Disposition- possible discharge tomorrow if Head CT and Hg stable. PT/OT recommend HHPT/OT.    LOS: 2 days   Lachrisha Ziebarth,W DOUGLAS 12/26/2011, 7:59 AM

## 2011-12-26 NOTE — Progress Notes (Signed)
Patient ID: Mario Warner, male   DOB: Mar 28, 1917, 78 y.o.   MRN: [id number]    Subjective:  Denies SSCP, palpitations or Dyspnea   Objective:  Filed Vitals:   12/25/11 0500 12/25/11 1430 12/25/11 2100 12/26/11 0500  BP: 149/80 147/79 178/82 157/82  Pulse: 61 121 58 60  Temp: 98.2 F (36.8 C) 97.8 F (36.6 C) 97.6 F (36.4 C) 98.3 F (36.8 C)  TempSrc: Oral Oral    Resp: 20 20 18 16   Height:      Weight:      SpO2: 100% 100% 98% 100%    Intake/Output from previous day:  Intake/Output Summary (Last 24 hours) at 12/26/11 0816 Last data filed at 12/25/11 2100  Gross per 24 hour  Intake    716 ml  Output    750 ml  Net    -34 ml    Physical Exam: Affect appropriate Healthy:  appears stated age HEENT: normal Neck supple with no adenopathy JVP normal no bruits no thyromegaly Lungs clear with no wheezing and good diaphragmatic motion Heart:  S1/S2 no murmur, no rub, gallop or click PMI normal Abdomen: benighn, BS positve, no tenderness, no AAA no bruit.  No HSM or HJR Distal pulses intact with no bruits No edema Neuro non-focal Skin warm and dry No muscular weakness   Lab Results: Basic Metabolic Panel:  Basename 12/26/11 0405 12/25/11 0321  NA 137 141  K 3.8 3.9  CL 104 109  CO2 25 24  GLUCOSE 77 46*  BUN 19 19  CREATININE 1.41* 1.45*  CALCIUM 9.0 8.9  MG -- --  PHOS -- --   Liver Function Tests:  [facility] 12/24/11 1219  AST 21  ALT 8  ALKPHOS 90  BILITOT 0.4  PROT 7.4  ALBUMIN 3.5   No results found for this basename: LIPASE:2,AMYLASE:2 in the last 72 hours CBC:  Basename 12/26/11 0405 12/25/11 1529 12/24/11 1219  WBC 3.9* 4.4 --  NEUTROABS -- -- 3.0  HGB 11.9* 12.0* --  HCT 35.1* 34.8* --  MCV 81.6 81.7 --  PLT 115* 108* --   Cardiac Enzymes:  Basename 12/25/11 0814 12/25/11 0317 12/24/11 2035  CKTOTAL 83 90 103  CKMB 2.0 1.9 2.4  CKMBINDEX -- -- --  TROPONINI <0.30 <0.30 <0.30   BNP: No components found with this basename:  POCBNP:3 D-Dimer: No results found for this basename: DDIMER:2 in the last 72 hours Hemoglobin A1C:  Basename 12/24/11 2035  HGBA1C 6.4*    Imaging: Dg Chest 2 View  12/24/2011  *RADIOLOGY REPORT*  Clinical Data: .  Syncopal episode  CHEST - 2 VIEW  Comparison: 02/17/2011  Findings: The lungs are clear without focal infiltrate, edema, pneumothorax or pleural effusion. Interstitial markings are diffusely coarsened with chronic features. The cardiopericardial silhouette is within normal limits for size.  Left-sided dual lead permanent pacemaker remains in place. Imaged bony structures of the thorax are intact.  Mild compression deformity of a mid thoracic vertebral body is stable since 11/04/2010.  IMPRESSION: Stable exam.  No acute findings.  Original Report Authenticated By: ERIC A. MANSELL, M.D.   Ct Head Wo Contrast  12/24/2011  *RADIOLOGY REPORT*  Clinical Data: Syncopal event.  On Coumadin.  Hypertension. Diabetes mellitus.  Cardiac arrhythmia.  CT HEAD WITHOUT CONTRAST  Technique:  Contiguous axial images were obtained from the base of the skull through the vertex without contrast.  Comparison: None.  Findings: There is no evidence for acute infarction, acute intra- axial hemorrhage, mass lesion,  or hydrocephalus.  Small 4 mm right frontal and right parietal extra-axial fluid collection of low to intermediate attenuation suggestive of subacute subdural hematoma. This was not present on previous study from 2012.  There is no significant mass effect on the underlying brain or midline shift.  Moderate atrophy is present.  Chronic microvascular ischemic changes are noted.  The calvarium is intact.  Vascular calcification is present.  There is no sinus or mastoid disease.  IMPRESSION: Right frontoparietal extra-axial fluid collection, low to intermediate attenuation, suspect 4 mm subacute subdural hematoma. This was not present on previous CT 02/17/2011.  Atrophy with chronic microvascular ischemic  change is stable.  Original Report Authenticated By: Elsie Stain, M.D.    Cardiac Studies:  ECG:  afib with V pacing LBBB morphology   Telemetry: afib underlying rhythm with v pacing   Echo: Normal EF 60%  Mild LVH Medications:      . sodium chloride   Intravenous STAT  . amLODipine  5 mg Oral Daily  . atorvastatin  20 mg Oral Daily  . carvedilol  12.5 mg Oral BID WC  . digoxin  0.125 mg Oral Daily  . losartan  100 mg Oral Daily   And  . hydrochlorothiazide  25 mg Oral Daily  . insulin aspart  0-15 Units Subcutaneous TID WC  . insulin aspart  0-5 Units Subcutaneous QHS  . insulin glargine  10 Units Subcutaneous QHS  . sodium chloride  3 mL Intravenous Q12H  . sodium chloride  3 mL Intravenous Q12H  . DISCONTD: insulin glargine  20 Units Subcutaneous QHS       Assessment/Plan:  Syncope:  See note by [facility].  ? Low BS   No obvious cardiac etiology.  Echo is normal .  I would be a little concerned about coumadin in 78 yo who is  Having "syncope" without obvious etiology and old subdural.   HTN:  Certainly not postural consider increasing norvasc if BP stays high  ? D/C am Will sign off  No further cardiac w/u planned Charlton Haws 12/26/2011, 8:16 AM

## 2011-12-27 LAB — CBC
HCT: 34.8 % — ABNORMAL LOW (ref 39.0–52.0)
Hemoglobin: 12 g/dL — ABNORMAL LOW (ref 13.0–17.0)
MCHC: 34.5 g/dL (ref 30.0–36.0)
RBC: 4.26 MIL/uL (ref 4.22–5.81)
WBC: 3.9 10*3/uL — ABNORMAL LOW (ref 4.0–10.5)

## 2011-12-27 LAB — GLUCOSE, CAPILLARY: Glucose-Capillary: 86 mg/dL (ref 70–99)

## 2011-12-27 LAB — BASIC METABOLIC PANEL
BUN: 25 mg/dL — ABNORMAL HIGH (ref 6–23)
Chloride: 104 mEq/L (ref 96–112)
GFR calc non Af Amer: 38 mL/min — ABNORMAL LOW (ref >90)
Glucose, Bld: 85 mg/dL (ref 70–99)
Potassium: 3.9 mEq/L (ref 3.5–5.1)
Sodium: 138 mEq/L (ref 135–145)

## 2011-12-27 LAB — PROTIME-INR
INR: 1.16 (ref 0.00–1.49)
Prothrombin Time: 15 seconds (ref 11.6–15.2)

## 2011-12-27 MED ORDER — BIMATOPROST 0.03 % OP SOLN: 1.0000 [drp] | Freq: Every day | OPHTHALMIC | Status: DC

## 2011-12-27 MED ORDER — AMLODIPINE BESYLATE 5 MG PO TABS: 5.0000 mg | ORAL_TABLET | Freq: Every day | ORAL | Status: AC

## 2011-12-27 MED ORDER — INSULIN NPH ISOPHANE & REGULAR (70-30) 100 UNIT/ML ~~LOC~~ SUSP: SUBCUTANEOUS | Status: DC

## 2011-12-27 MED ORDER — DORZOLAMIDE HCL 2 % OP SOLN: 1.0000 [drp] | Freq: Three times a day (TID) | OPHTHALMIC | Status: DC

## 2011-12-27 NOTE — Progress Notes (Signed)
OT Discharge Note  Patient is being discharged from OT services secondary to:    Goals met and no further therapy needs identified.  Please see latest Therapy Progress Note for current level of functioning and progress toward goals.  Progress and discharge plan and discussed with patient/caregiver and they    Agree Pt dressed and ready to go home     Lucile Shutters   OTR/L Pager: [phone] Office: [phone] .

## 2011-12-27 NOTE — Progress Notes (Signed)
Utilization review completed.  

## 2011-12-27 NOTE — Care Management Note (Signed)
    Page 1 of 2   12/27/2011     9:31:29 AM   CARE MANAGEMENT NOTE 12/27/2011  Patient:  Mario Warner, Mario Warner   Account Number:  [id number]  Date Initiated:  12/27/2011  Documentation initiated by:  Donn Pierini  Subjective/Objective Assessment:   Pt admitted with syncope     Action/Plan:   PTA pt lived at home alone, PT eval   Anticipated DC Date:  12/27/2011   Anticipated DC Plan:  HOME W HOME HEALTH SERVICES      DC Planning Services  CM consult      [facility]  HOME HEALTH   Choice offered to / List presented to:  C-1 Patient        HH arranged  HH-2 PT      [facility] agency  [facility]   Status of service:  Completed, signed off Medicare Important Message given?   (If response is "NO", the following Medicare IM given date fields will be blank) Date Medicare IM given:   Date Additional Medicare IM given:    Discharge Disposition:  HOME W HOME HEALTH SERVICES  Per UR Regulation:  Reviewed for med. necessity/level of care/duration of stay  If discussed at Long Length of Stay Meetings, dates discussed:    Comments:  PCP- Tisovec  12/27/11- 0920- Donn Pierini RN, BSN [phone] Pt for discharge home today, orders for HH-PT, spoke with pt at bedside regarding HH- pt is agreeable to [facility] services- states that he has never had HH services before- list of agencies for [facility] given to pt- per pt choice- [facility] is who he would like to use for HH-PT. Referral sent to [facility] via [facility] and call made to [facility] with [facility]. Pt states that he has a cane at home- no other DME needed. HH services to begin within 24-48 hrs post discharge.

## 2011-12-27 NOTE — Progress Notes (Signed)
Physical Therapy Treatment Patient Details Name: Mario Warner MRN: [id number] DOB: [birth year] Today's Date: 12/27/2011 Time: 1308-6578 PT Time Calculation (min): 19 min  PT Assessment / Plan / Recommendation Comments on Treatment Session  Managing well with cane; pt reports will have plaenty of assist at home from local daughters; Pt is happy to have HHPT come to home to work on balance    Follow Up Recommendations  Home health PT;Supervision - Intermittent    Barriers to Discharge        Equipment Recommendations  None recommended by PT (already has cane)    Recommendations for Other Services    Frequency Min 4X/week   Plan Discharge plan remains appropriate    Precautions / Restrictions Precautions Precautions: Fall Restrictions Weight Bearing Restrictions: No   Pertinent Vitals/Pain Pain in Left knee; did not rate; RN notified and gave meds     Mobility  Bed Mobility Bed Mobility: Supine to Sit;Sitting - Scoot to Edge of Bed Supine to Sit: 7: Independent Sitting - Scoot to Edge of Bed: 7: Independent Details for Bed Mobility Assistance: No cues or assistance needed. Transfers Transfers: Sit to Stand;Stand to Sit Sit to Stand: 5: Supervision;With upper extremity assist;From bed Stand to Sit: 5: Supervision;With upper extremity assist;With armrests;To chair/3-in-1 Details for Transfer Assistance: Cues for safe hand placement.  Supervision due to slightly decreased balance. Ambulation/Gait Ambulation/Gait Assistance: 5: Supervision;4: Min guard;4: Min assist Ambulation Distance (Feet): 140 Feet Assistive device: Straight cane Ambulation/Gait Assistance Details: Overall managing well with cane; one loss of balance requiring min assist to regain balance; pt attributes this to L knee arthritis Gait Pattern: Step-through pattern Stairs: Yes Stairs Assistance: 4: Min guard Stair Management Technique: One rail Left;With cane Number of Stairs: 10  (Cues for safety,  sequence) Modified Rankin (Stroke Patients Only) Pre-Morbid Rankin Score: Moderate disability Modified Rankin: Moderately severe disability    Exercises     PT Diagnosis:    PT Problem List:   PT Treatment Interventions:     PT Goals Acute Rehab PT Goals Time For Goal Achievement: 01/01/12 Potential to Achieve Goals: Good Pt will go Sit to Stand: with modified independence;with upper extremity assist PT Goal: Sit to Stand - Progress: Progressing toward goal Pt will go Stand to Sit: with modified independence;with upper extremity assist PT Goal: Stand to Sit - Progress: Progressing toward goal Pt will Ambulate: >150 feet;with modified independence;with cane PT Goal: Ambulate - Progress: Progressing toward goal Pt will Go Up / Down Stairs: 3-5 stairs;with modified independence;with least restrictive assistive device PT Goal: Up/Down Stairs - Progress: Progressing toward goal  Visit Information  Last PT Received On: 12/27/11 Assistance Needed: +1    Subjective Data  Subjective: Going home today Patient Stated Goal: To go home soon   Cognition  Overall Cognitive Status: Appears within functional limits for tasks assessed/performed Arousal/Alertness: Awake/alert Orientation Level: Appears intact for tasks assessed Behavior During Session: [facility] for tasks performed    Balance     End of Session PT - End of Session Equipment Utilized During Treatment: Gait belt Activity Tolerance: Patient tolerated treatment well Patient left: in chair;with call bell/phone within reach Nurse Communication: Mobility status   GP     Mario Warner, Mario Warner [phone]  12/27/2011, 8:51 AM

## 2011-12-27 NOTE — Discharge Summary (Signed)
DISCHARGE SUMMARY  Mario Warner  MR#: [id number]  DOB:1916/11/29  Date of Admission: 12/24/2011 Date of Discharge: 12/27/2011  Attending Physician:Lyda Colcord W  Patient's WUJ:WJXBJYN,WGNFAOZ W, MD  Consults:Treatment Team:  Rounding Lbcardiology, MD  Discharge Diagnoses: Principal Problem:  *Syncope, due to early insulin dose and delayed eating, hypoglycemia Active Problems:  DM  HYPERLIPIDEMIA  HYPERTENSION  Atrial flutter  Subdural hematoma  Early dementia, nonspecified  Discharge Medications: Medication List  As of 12/27/2011  7:50 AM   STOP taking these medications         warfarin 5 MG tablet         TAKE these medications         amLODipine 5 MG tablet   Commonly known as: NORVASC   Take 1 tablet (5 mg total) by mouth daily.      atorvastatin 20 MG tablet   Commonly known as: LIPITOR   Take 20 mg by mouth daily.      bimatoprost 0.03 % ophthalmic solution   Commonly known as: LUMIGAN   Place 1 drop into both eyes at bedtime.      carvedilol 12.5 MG tablet   Commonly known as: COREG   TAKE 1 TABLET BY MOUTH TWICE DAILY WITH MEALS      digoxin 0.25 MG tablet   Commonly known as: LANOXIN   Take 0.125 mg by mouth daily.      dorzolamide 2 % ophthalmic solution   Commonly known as: TRUSOPT   Place 1 drop into both eyes 3 (three) times daily.      insulin NPH-insulin regular (70-30) 100 UNIT/ML injection   Commonly known as: NOVOLIN 70/30   10 units twice daily, but not more than 20 minutes prior to eating      losartan-hydrochlorothiazide 100-25 MG per tablet   Commonly known as: HYZAAR   Take 1 tablet by mouth daily.            Hospital Procedures: Dg Chest 2 View  12/24/2011  *RADIOLOGY REPORT*  Clinical Data: .  Syncopal episode  CHEST - 2 VIEW  Comparison: 02/17/2011  Findings: The lungs are clear without focal infiltrate, edema, pneumothorax or pleural effusion. Interstitial markings are diffusely coarsened with chronic features. The  cardiopericardial silhouette is within normal limits for size.  Left-sided dual lead permanent pacemaker remains in place. Imaged bony structures of the thorax are intact.  Mild compression deformity of a mid thoracic vertebral body is stable since 11/04/2010.  IMPRESSION: Stable exam.  No acute findings.  Original Report Authenticated By: ERIC A. MANSELL, M.D.   Ct Head Wo Contrast  12/26/2011  *RADIOLOGY REPORT*  Clinical Data: Follow-up subdural hematoma.  Syncopal episode.  CT HEAD WITHOUT CONTRAST  Technique:  Contiguous axial images were obtained from the base of the skull through the vertex without contrast.  Comparison: 12/24/2011  Findings: 4 mm right frontal extra-axial fluid collection is redemonstrated, similar to recent priors, consistent with subacute to early chronic subdural hematoma.  Slight flattening of the cortex without midline shift.  Unchanged atrophy with chronic microvascular ischemic change.  Calvarium intact.  No visible acute stroke, acute hemorrhage, or intra-axial mass lesion.  IMPRESSION: Unchanged right frontal extra-axial fluid collection, stable 4 mm subdural hematoma.  Original Report Authenticated By: Elsie Stain, M.D.   Ct Head Wo Contrast  12/24/2011  *RADIOLOGY REPORT*  Clinical Data: Syncopal event.  On Coumadin.  Hypertension. Diabetes mellitus.  Cardiac arrhythmia.  CT HEAD WITHOUT CONTRAST  Technique:  Contiguous axial images  were obtained from the base of the skull through the vertex without contrast.  Comparison: None.  Findings: There is no evidence for acute infarction, acute intra- axial hemorrhage, mass lesion, or hydrocephalus.  Small 4 mm right frontal and right parietal extra-axial fluid collection of low to intermediate attenuation suggestive of subacute subdural hematoma. This was not present on previous study from 2012.  There is no significant mass effect on the underlying brain or midline shift.  Moderate atrophy is present.  Chronic microvascular  ischemic changes are noted.  The calvarium is intact.  Vascular calcification is present.  There is no sinus or mastoid disease.  IMPRESSION: Right frontoparietal extra-axial fluid collection, low to intermediate attenuation, suspect 4 mm subacute subdural hematoma. This was not present on previous CT 02/17/2011.  Atrophy with chronic microvascular ischemic change is stable.  Original Report Authenticated By: Elsie Stain, M.D.    History of Present Illness: Patient is a 78 year old AA male with atrial flutter and ablation and pacer earlier this year.  Comes to the hospital with AMS at [facility].  Hospital Course: Patient noted taking his insulin and going to the restaurant 1 hour later to eat, has poor LOC initially, EMS called after having syncope in the bathroom.  Blood sugar normal by the time he arrived at the ER.  Was seen by Cards in consult due to recent tachy-brady and aflutter.  Cranial imaging showed what appears to be an old subdural hematoma.  Coumadin was discontinued and his INR is now approaching normally.  He is normally followed by the Coumadin Clinic at [facility], and per their records, he had missed his last appointment for a check with them.  He was watched on telemetry, adjustments were made to decrease his insulin dose and instructed on needing to take his insulin at the time of meal and not far in advance with a mix insulin.  The rest of his hospital course was unremarkable and he was discharged in good condition.  Given his fall, he was seen by PT and it is recommended that he have outpatient therapy, which will be arranged by social work prior to discharge.  Day of Discharge Exam BP 118/74  Pulse 60  Temp 98.1 F (36.7 C) (Oral)  Resp 18  Ht 6\' 3"  (1.905 m)  Wt 77.4 kg (170 lb 10.2 oz)  BMI 21.33 kg/m2  SpO2 100%  Physical Exam: General appearance: alert, cooperative and appears stated age Eyes: no scleral icterus Throat: oropharynx moist without  erythema Resp: clear to auscultation bilaterally Cardio: regular rate and rhythm, S1, S2 normal, no murmur, click, rub or gallop GI: soft, non-tender; bowel sounds normal; no masses,  no organomegaly Extremities: no clubbing, cyanosis or edema  Discharge Labs:  [facility] 12/27/11 0500 12/26/11 0405  NA 138 137  K 3.9 3.8  CL 104 104  CO2 24 25  GLUCOSE 85 77  BUN 25* 19  CREATININE 1.52* 1.41*  CALCIUM 9.0 9.0  MG -- --  PHOS -- --    Basename 12/24/11 1219  AST 21  ALT 8  ALKPHOS 90  BILITOT 0.4  PROT 7.4  ALBUMIN 3.5    Basename 12/27/11 0500 12/26/11 0405 12/24/11 1219  WBC 3.9* 3.9* --  NEUTROABS -- -- 3.0  HGB 12.0* 11.9* --  HCT 34.8* 35.1* --  MCV 81.7 81.6 --  PLT 124* 115* --   Lab Results  Component Value Date   INR 1.16 12/27/2011   INR 1.25 12/26/2011  INR 1.19 12/25/2011    Basename 12/25/11 0814 12/25/11 0317 12/24/11 2035  CKTOTAL 83 90 103  CKMB 2.0 1.9 2.4  CKMBINDEX -- -- --  TROPONINI <0.30 <0.30 <0.30   Discharge instructions: Discharge Orders    Future Orders Please Complete By Expires   Ambulatory referral to Physical Therapy        Disposition: Home with home PT.  Follow-up Appts: Follow-up with Dr. Wylene Simmer at [facility] on Friday July 19 at noon.    Condition on Discharge: Stable Tests Needing Follow-up:  Coumadin per the discretion of his Cardiologist at [facility] after subdural hematoma resolves. Signed: Gaspar Garbe 12/27/2011, 7:50 AM

## 2011-12-27 NOTE — Progress Notes (Signed)
HOME HEALTH AGENCIES SERVING [place]   Agencies that are Medicare-Certified and are affiliated with [facility]  Telephone Number Address  [facility]   The [facility] has ownership interest in this company; however, you are under no obligation to use this agency. [phone] or  [phone] [address] [place], [place] [address]   Agencies that are Medicare-Certified and are not affiliated with [facility]                                                                                 Home Health Agency Telephone Number Address  [facility] [phone] Fax [phone] [address] [place], [place]  [address]  [facility] [phone] or [phone] Fax [phone] [address] [place], [place] [address]  [facility] [phone] Fax [phone] [address] [place], [place] [address]  [facility] [phone] Fax [phone] [address] [place], [place]  [address]  [facility] [phone] Fax [phone] [address], [address] [place], [place] [address]  [facility] [phone] [address] , [place] [address]  [facility] [phone]  [address] [place], [place] [address]  [facility] [phone] or [phone] Fax [phone] [address] [place], [place]  [address]  [facility] [phone] Fax [phone] [address] [place], [place]  [address]  [facility]  [phone] Fax [phone] [address] [place], [place] [address]               Agencies that are not Medicare-Certified and are not affiliated with The  Health System   Home Health Agency Telephone Number Address  [facility] [phone] or [phone] Fax [phone] [address] [place], [place]  [address]  [facility] [phone] Fax [phone] [address] [place], [place]  [address]  [facility]  [phone] Fax [phone] [address] [place], [place] [address]  [facility] [phone] Fax [phone] [address] , [place] [address]  [facility] [phone] or [phone] Fax [phone] [address] [place], [place]  [address]  [facility] [phone] or [phone] Fax [phone] [address] [place], [place]  [address]  [facility] [phone] Fax [phone] [address] [place], [place]  [address]  [facility] [phone] [address] [place], [place]  [address]  [facility] [phone] Fax [phone] [address] [place], [place]  [address]  [facility] [phone] Fax [phone] [address] [place], [place]  [address]  [facility] [phone] Fax [phone] [address] [place], [place] [address]  [facility] [phone] Fax [phone] [address] ,   [address]   

## 2012-01-31 ENCOUNTER — Ambulatory Visit: Payer: Self-pay | Admitting: Cardiovascular Disease

## 2012-01-31 DIAGNOSIS — I4892 Unspecified atrial flutter: Secondary | ICD-10-CM

## 2012-02-07 ENCOUNTER — Telehealth: Payer: Self-pay | Admitting: Internal Medicine

## 2012-02-07 NOTE — Telephone Encounter (Signed)
Walk In pt Form " DOT papers" Dropped Off By Pt,placed In  Dr.Allred Doc Box for Completion  02/07/12/KM

## 2012-02-21 NOTE — Telephone Encounter (Signed)
DMV papers Completed By Allred,called LMOVM for Pt 02/21/12/KM

## 2012-06-08 ENCOUNTER — Other Ambulatory Visit: Payer: Self-pay | Admitting: *Deleted

## 2012-06-08 MED ORDER — CARVEDILOL 12.5 MG PO TABS: 12.5000 mg | ORAL_TABLET | Freq: Two times a day (BID) | ORAL | Status: DC

## 2012-07-27 ENCOUNTER — Encounter: Payer: Medicare Other | Admitting: Cardiology

## 2012-07-28 ENCOUNTER — Encounter: Payer: Medicare Other | Admitting: Cardiology

## 2012-08-10 ENCOUNTER — Encounter: Payer: Self-pay | Admitting: Cardiology

## 2012-08-10 ENCOUNTER — Ambulatory Visit (INDEPENDENT_AMBULATORY_CARE_PROVIDER_SITE_OTHER): Payer: Medicare Other | Admitting: Cardiology

## 2012-08-10 ENCOUNTER — Encounter: Payer: Self-pay | Admitting: Internal Medicine

## 2012-08-10 VITALS — BP 134/72 | HR 60 | Ht 75.0 in

## 2012-08-10 DIAGNOSIS — I4892 Unspecified atrial flutter: Secondary | ICD-10-CM

## 2012-08-10 DIAGNOSIS — I498 Other specified cardiac arrhythmias: Secondary | ICD-10-CM

## 2012-08-10 DIAGNOSIS — I442 Atrioventricular block, complete: Secondary | ICD-10-CM

## 2012-08-10 DIAGNOSIS — R001 Bradycardia, unspecified: Secondary | ICD-10-CM

## 2012-08-10 DIAGNOSIS — Z950 Presence of cardiac pacemaker: Secondary | ICD-10-CM

## 2012-08-10 LAB — PACEMAKER DEVICE OBSERVATION
BAMS-0001: 130 {beats}/min
BATTERY VOLTAGE: 2.96 V
DEVICE MODEL PM: 2660317
RV LEAD IMPEDENCE PM: 600 Ohm

## 2012-08-10 NOTE — Progress Notes (Signed)
ELECTROPHYSIOLOGY OFFICE NOTE  Patient ID: Mario Warner. MRN: [id number], DOB/AGE: [birth year]   Date of Visit: 08/10/2012  Primary Physician: Gaspar Garbe, MD Primary Cardiologist: Johney Frame, MD Reason for Visit: EP/device follow-up  History of Present Illness  Mario Krotz. is a pleasant 79 year old man with symptomatic bradycardia s/p PPM implant and chronic AFlutter who presents today for routine electrophysiology followup. Since last being seen in our clinic, he reports he is doing well and has no complaints. Today, he specifically denies chest pain or shortness of breath. He denies palpitations, dizziness, near syncope or syncope. He denies LE swelling, orthopnea, PND or recent weight gain. He reports that he is compliant and tolerating medications without difficulty. He is still quite active and bowls regularly in a league. Of note, at his last visit with Dr. Johney Frame May 2013 his PPM mode was programmed VVIR in setting of chronic AFlutter. In addition, he was hospitalized July 2013 with syncope and fall due to hypoglycemia after insulin administration and at that time was found to have a subdural hematoma. His warfarin was discontinued and he has remained off anticoagulation since that time.  Past Medical History Past Medical History  Diagnosis Date  . DJD (degenerative joint disease)   . Lumbar disc disease   . Glaucoma(365)   . DM (diabetes mellitus)   . Benign prostatic hypertrophy   . Hyperlipidemia   . HTN (hypertension)   . Bradycardia     s/p PPM by Dr Johney Frame  . Atrial flutter     chronically anticoagulated with coumadin  . DJD (degenerative joint disease)   . Pacemaker     Past Surgical History Past Surgical History  Procedure Laterality Date  . Transurethral resection of prostate    . Hemorrhoid surgery    . Pacemaker insertion  5/12    by Dr Johney Frame - St. Jude device  . Insert / replace / remove pacemaker       Allergies/Intolerances No Known  Allergies  Current Home Medications Current Outpatient Prescriptions  Medication Sig Dispense Refill  . amLODipine (NORVASC) 5 MG tablet Take 1 tablet (5 mg total) by mouth daily.  30 tablet  5  . atorvastatin (LIPITOR) 20 MG tablet Take 20 mg by mouth daily.        . carvedilol (COREG) 12.5 MG tablet Take 1 tablet (12.5 mg total) by mouth 2 (two) times daily with a meal.  60 tablet  6  . Insulin Isophane & Regular (HUMULIN 70/30 KWIKPEN Dickens) Inject into the skin as directed.       No current facility-administered medications for this visit.    Social History Social History  . Marital Status: Widowed   Social History Main Topics  . Smoking status: Former Smoker    Quit date: 02/05/1971  . Smokeless tobacco: Never Used  . Alcohol Use: No  . Drug Use: No   Social History Narrative   Lives alone.      Review of Systems General: No chills, fever, night sweats or weight changes Cardiovascular: No chest pain, dyspnea on exertion, edema, orthopnea, palpitations, paroxysmal nocturnal dyspnea Dermatological: No rash, lesions or masses Respiratory: No cough, dyspnea Urologic: No hematuria, dysuria Abdominal: No nausea, vomiting, diarrhea, bright red blood per rectum, melena, or hematemesis Neurologic: No visual changes, weakness, changes in mental status All other systems reviewed and are otherwise negative except as noted above.  Physical Exam Blood pressure 134/72, pulse 60, height 6\' 3"  (1.905 m), SpO2 96.00%.  General:  Well developed, well appearing 79 year old male in no acute distress. He appears much younger than his stated age. HEENT: Normocephalic, atraumatic. EOMs intact. Sclera nonicteric. Oropharynx clear.  Neck: Supple. No JVD. Lungs: Respirations regular and unlabored, CTA bilaterally. No wheezes, rales or rhonchi. Heart: Regular. S1, S2 present. No murmurs, rub, S3 or S4. Abdomen: Soft, non-distended.  Extremities: No clubbing, cyanosis or edema. DP/PT/Radials 2+ and  equal bilaterally. Psych: Normal affect. Neuro: Alert and oriented X 3. Moves all extremities spontaneously.   Diagnostics Device interrogation today shows normal PPM function with stable lead measurements/parameters; no episodes; histograms appropriate; no programming changes made; see PaceArt report  Assessment and Plan 1. Symptomatic bradycardia s/p PPM implant Normal device function No programming changes made  See PaceArt report 2. Chronic AFlutter Stable PPM programmed VVIR Discussed need for chronic anticoagulation to reduce stroke risk while balancing his bleeding risk since his SDH July 2013 - Dr. Johney Frame called Mario Warner PCP, Dr. Wylene Simmer, to discuss and is awaiting a call back  This plan of care was formulated with Dr. Johney Frame who is in clinic with me today. Signed, Rick Duff, PA-C 08/10/2012, 10:48 AM  ADDENDUM: Dr. Johney Frame spoke with Dr. Wylene Simmer via phone regarding anticoagulation. Dr. Wylene Simmer states he is hesitant at this time to restart anticoagulaton and would like to see Mario Warner in follow-up in 2 weeks to discuss further. Dr. Johney Frame reviewed the options for Mario Warner with Dr. Wylene Simmer and those are as follows: 1) no anticoagulation if there is concern for increased bleeding risk, with the understanding that this puts Mario Warner at increased stroke risk  2) chronic anticoagulation if risk of stroke > risk of bleeding  3) short term anticoagulation to allow for EPS +RF ablation of AFlutter then no need for long term anticoagulation, although this does involve an invasive approach to treatment which Mario Warner may not want to pursue. Dr. Wylene Simmer will call Dr. Johney Frame once a decision has been reached.   Signed, Rick Duff, PA-C 08/10/2012, 2:55 PM

## 2012-08-10 NOTE — Patient Instructions (Signed)
Your physician wants you to follow-up in: 1 year with Dr Allred.  You will receive a reminder letter in the mail two months in advance. If you don't receive a letter, please call our office to schedule the follow-up appointment.  

## 2013-06-09 ENCOUNTER — Other Ambulatory Visit: Payer: Self-pay | Admitting: Internal Medicine

## 2013-10-03 ENCOUNTER — Ambulatory Visit (INDEPENDENT_AMBULATORY_CARE_PROVIDER_SITE_OTHER): Payer: Medicare Other | Admitting: Internal Medicine

## 2013-10-03 ENCOUNTER — Encounter: Payer: Self-pay | Admitting: Internal Medicine

## 2013-10-03 VITALS — BP 190/82 | HR 60 | Ht 75.0 in | Wt 178.0 lb

## 2013-10-03 DIAGNOSIS — I442 Atrioventricular block, complete: Secondary | ICD-10-CM

## 2013-10-03 DIAGNOSIS — I4892 Unspecified atrial flutter: Secondary | ICD-10-CM

## 2013-10-03 LAB — MDC_IDC_ENUM_SESS_TYPE_INCLINIC
Lead Channel Impedance Value: 550 Ohm
Lead Channel Pacing Threshold Amplitude: 0.75 V
Lead Channel Setting Pacing Amplitude: 0.75 V
Lead Channel Setting Pacing Amplitude: 3.5 V
Lead Channel Setting Pacing Pulse Width: 0.5 ms
MDC IDC MSMT BATTERY VOLTAGE: 2.95 V
MDC IDC MSMT LEADCHNL RV PACING THRESHOLD PULSEWIDTH: 0.4 ms
MDC IDC PG SERIAL: 2660317
MDC IDC SET LEADCHNL RV SENSING SENSITIVITY: 2 mV
MDC IDC STAT BRADY RV PERCENT PACED: 99 %

## 2013-10-03 NOTE — Progress Notes (Signed)
PCP: Haywood Pao, MD  Arshdeep Bolger. is a 80 y.o. male who presents today for routine electrophysiology followup.  Since last being seen in our clinic, the patient reports doing very well.  Today, he denies symptoms of palpitations, chest pain, shortness of breath,  lower extremity edema, dizziness, presyncope, or syncope.  The patient is otherwise without complaint today.   Past Medical History  Diagnosis Date  . DJD (degenerative joint disease)   . Lumbar disc disease   . Glaucoma   . DM (diabetes mellitus)   . Benign prostatic hypertrophy   . Hyperlipidemia   . HTN (hypertension)   . Bradycardia     s/p PPM by Dr Rayann Heman  . Atrial flutter     chronically anticoagulated with coumadin  . DJD (degenerative joint disease)   . Pacemaker    Past Surgical History  Procedure Laterality Date  . Transurethral resection of prostate    . Hemorrhoid surgery    . Pacemaker insertion  5/12    by Dr Rayann Heman - St. Jude device  . Insert / replace / remove pacemaker      Current Outpatient Prescriptions  Medication Sig Dispense Refill  . amLODipine (NORVASC) 5 MG tablet Take 1 tablet (5 mg total) by mouth daily.  30 tablet  5  . atorvastatin (LIPITOR) 20 MG tablet Take 20 mg by mouth daily.        . carvedilol (COREG) 12.5 MG tablet TAKE 1 TABLET BY MOUTH TWICE DAILY WITH A MEAL  60 tablet  6  . Insulin Isophane & Regular (HUMULIN 70/30 KWIKPEN [place]) Inject into the skin as directed.       No current facility-administered medications for this visit.    Physical Exam: Filed Vitals:   10/03/13 1135  BP: 190/82  Pulse: 60  Height: 6\' 3"  (1.905 m)  Weight: 178 lb (80.74 kg)    GEN- The patient is well appearing, alert and oriented x 3 today.   Head- normocephalic, atraumatic Eyes-  Sclera clear, conjunctiva pink Ears- hearing intact Oropharynx- clear Lungs- Clear to ausculation bilaterally, normal work of breathing Chest- pacemaker pocket is well healed Heart- Regular rate and  rhythm (paced)  GI- soft, NT, ND, + BS Extremities- no clubbing, cyanosis, or edema  Pacemaker interrogation- reviewed in detail today,  See PACEART report  Assessment and Plan:  1. Permanent atrial flutter Rate controlled I have spoken with Dr Osborne Casco previously and given advanced age and patient preference, we will not anticoagulate this patient  2. Complete AV block Normal pacemaker function See Pace Art report No changes today  Return to see Jerene Pitch in 1 year

## 2013-10-03 NOTE — Patient Instructions (Signed)
Your physician wants you to follow-up in: 1 year with Ileene Hutchinson, PA.  You will receive a reminder letter in the mail two months in advance. If you don't receive a letter, please call our office to schedule the follow-up appointment.

## 2014-06-18 DIAGNOSIS — J209 Acute bronchitis, unspecified: Secondary | ICD-10-CM | POA: Diagnosis not present

## 2014-06-18 DIAGNOSIS — Z6823 Body mass index (BMI) 23.0-23.9, adult: Secondary | ICD-10-CM | POA: Diagnosis not present

## 2014-06-18 DIAGNOSIS — R52 Pain, unspecified: Secondary | ICD-10-CM | POA: Diagnosis not present

## 2014-06-21 ENCOUNTER — Other Ambulatory Visit: Payer: Self-pay | Admitting: Internal Medicine

## 2014-07-30 DIAGNOSIS — Z008 Encounter for other general examination: Secondary | ICD-10-CM | POA: Diagnosis not present

## 2014-07-30 DIAGNOSIS — I10 Essential (primary) hypertension: Secondary | ICD-10-CM | POA: Diagnosis not present

## 2014-07-30 DIAGNOSIS — E1129 Type 2 diabetes mellitus with other diabetic kidney complication: Secondary | ICD-10-CM | POA: Diagnosis not present

## 2014-07-30 DIAGNOSIS — E785 Hyperlipidemia, unspecified: Secondary | ICD-10-CM | POA: Diagnosis not present

## 2014-08-06 DIAGNOSIS — I480 Paroxysmal atrial fibrillation: Secondary | ICD-10-CM | POA: Diagnosis not present

## 2014-08-06 DIAGNOSIS — Z0000 Encounter for general adult medical examination without abnormal findings: Secondary | ICD-10-CM | POA: Diagnosis not present

## 2014-08-06 DIAGNOSIS — N529 Male erectile dysfunction, unspecified: Secondary | ICD-10-CM | POA: Diagnosis not present

## 2014-08-06 DIAGNOSIS — R809 Proteinuria, unspecified: Secondary | ICD-10-CM | POA: Diagnosis not present

## 2014-08-06 DIAGNOSIS — I10 Essential (primary) hypertension: Secondary | ICD-10-CM | POA: Diagnosis not present

## 2014-08-06 DIAGNOSIS — N183 Chronic kidney disease, stage 3 (moderate): Secondary | ICD-10-CM | POA: Diagnosis not present

## 2014-08-06 DIAGNOSIS — E1129 Type 2 diabetes mellitus with other diabetic kidney complication: Secondary | ICD-10-CM | POA: Diagnosis not present

## 2014-08-06 DIAGNOSIS — E785 Hyperlipidemia, unspecified: Secondary | ICD-10-CM | POA: Diagnosis not present

## 2015-02-03 ENCOUNTER — Encounter: Payer: Self-pay | Admitting: Internal Medicine

## 2015-02-03 DIAGNOSIS — E1129 Type 2 diabetes mellitus with other diabetic kidney complication: Secondary | ICD-10-CM | POA: Diagnosis not present

## 2015-02-03 DIAGNOSIS — M179 Osteoarthritis of knee, unspecified: Secondary | ICD-10-CM | POA: Diagnosis not present

## 2015-02-03 DIAGNOSIS — I480 Paroxysmal atrial fibrillation: Secondary | ICD-10-CM | POA: Diagnosis not present

## 2015-02-03 DIAGNOSIS — C61 Malignant neoplasm of prostate: Secondary | ICD-10-CM | POA: Diagnosis not present

## 2015-02-03 DIAGNOSIS — N183 Chronic kidney disease, stage 3 (moderate): Secondary | ICD-10-CM | POA: Diagnosis not present

## 2015-02-03 DIAGNOSIS — I10 Essential (primary) hypertension: Secondary | ICD-10-CM | POA: Diagnosis not present

## 2015-02-03 DIAGNOSIS — Z950 Presence of cardiac pacemaker: Secondary | ICD-10-CM | POA: Diagnosis not present

## 2015-02-03 DIAGNOSIS — E785 Hyperlipidemia, unspecified: Secondary | ICD-10-CM | POA: Diagnosis not present

## 2015-03-12 ENCOUNTER — Encounter: Payer: Commercial Managed Care - HMO | Admitting: Internal Medicine

## 2015-04-01 ENCOUNTER — Other Ambulatory Visit: Payer: Self-pay | Admitting: Internal Medicine

## 2015-04-07 ENCOUNTER — Telehealth: Payer: Self-pay | Admitting: Internal Medicine

## 2015-04-07 ENCOUNTER — Encounter: Payer: Self-pay | Admitting: Internal Medicine

## 2015-04-07 ENCOUNTER — Ambulatory Visit (INDEPENDENT_AMBULATORY_CARE_PROVIDER_SITE_OTHER): Payer: Commercial Managed Care - HMO | Admitting: Internal Medicine

## 2015-04-07 VITALS — BP 138/80 | HR 60 | Ht 75.0 in | Wt 178.0 lb

## 2015-04-07 DIAGNOSIS — I10 Essential (primary) hypertension: Secondary | ICD-10-CM | POA: Diagnosis not present

## 2015-04-07 DIAGNOSIS — I4892 Unspecified atrial flutter: Secondary | ICD-10-CM

## 2015-04-07 DIAGNOSIS — I442 Atrioventricular block, complete: Secondary | ICD-10-CM

## 2015-04-07 LAB — CUP PACEART INCLINIC DEVICE CHECK
Brady Statistic RA Percent Paced: 0 %
Brady Statistic RV Percent Paced: 99.88 %
Date Time Interrogation Session: 20161024110802
Implantable Lead Location: 753859
Implantable Lead Location: 753860
Lead Channel Pacing Threshold Amplitude: 0.5 V
Lead Channel Pacing Threshold Pulse Width: 0.5 ms
Lead Channel Sensing Intrinsic Amplitude: 12 mV
Lead Channel Setting Pacing Amplitude: 0.75 V
MDC IDC LEAD IMPLANT DT: 20120522
MDC IDC LEAD IMPLANT DT: 20120522
MDC IDC LEAD MODEL: 1948
MDC IDC MSMT BATTERY REMAINING LONGEVITY: 132 mo
MDC IDC MSMT BATTERY VOLTAGE: 2.95 V
MDC IDC MSMT LEADCHNL RV IMPEDANCE VALUE: 600 Ohm
MDC IDC MSMT LEADCHNL RV PACING THRESHOLD AMPLITUDE: 0.5 V
MDC IDC MSMT LEADCHNL RV PACING THRESHOLD PULSEWIDTH: 0.5 ms
MDC IDC SET LEADCHNL RV PACING PULSEWIDTH: 0.5 ms
MDC IDC SET LEADCHNL RV SENSING SENSITIVITY: 2 mV
Pulse Gen Model: 2210
Pulse Gen Serial Number: 2660317

## 2015-04-07 NOTE — Addendum Note (Signed)
Addended by: Gaetano Net on: 04/07/2015 04:08 PM   Modules accepted: Orders

## 2015-04-07 NOTE — Telephone Encounter (Signed)
Will take the 10 mg tablet off medication lisit

## 2015-04-07 NOTE — Telephone Encounter (Signed)
New message    Patient was told to call back with information seen in the office today    Amlodipine 5 mg

## 2015-04-07 NOTE — Progress Notes (Signed)
PCP: Haywood Pao, MD  Mario Warner. is a 81 y.o. male who presents today for routine electrophysiology followup.  Since last being seen in our clinic, the patient reports doing very well.  He continues to bowl and remains active.  Today, he denies symptoms of palpitations, chest pain, shortness of breath,  lower extremity edema, dizziness, presyncope, or syncope.  The patient is otherwise without complaint today.   Past Medical History  Diagnosis Date  . DJD (degenerative joint disease)   . Lumbar disc disease   . Glaucoma   . DM (diabetes mellitus) ([place])   . Benign prostatic hypertrophy   . Hyperlipidemia   . HTN (hypertension)   . Bradycardia     s/p PPM by Dr Rayann Heman  . Atrial flutter ([place])     chronically anticoagulated with coumadin  . DJD (degenerative joint disease)   . Pacemaker    Past Surgical History  Procedure Laterality Date  . Transurethral resection of prostate    . Hemorrhoid surgery    . Pacemaker insertion  5/12    by Dr Rayann Heman - St. Jude device  . Insert / replace / remove pacemaker      Current Outpatient Prescriptions  Medication Sig Dispense Refill  . amLODipine (NORVASC) 10 MG tablet TK 1 T PO  D  4  . atorvastatin (LIPITOR) 20 MG tablet Take 20 mg by mouth daily.      . carvedilol (COREG) 12.5 MG tablet TAKE 1 TABLET BY MOUTH TWICE DAILY WITH MEALS 60 tablet 0  . COMBIGAN 0.2-0.5 % ophthalmic solution INSTILL 1 DROP IN OU BID  0  . FLUZONE HIGH-DOSE 0.5 ML SUSY ADM 0.5ML IM UTD  0  . Insulin Isophane & Regular (HUMULIN 70/30 KWIKPEN [place]) Inject into the skin as directed.    Marland Kitchen NOVOLOG MIX 70/30 FLEXPEN (70-30) 100 UNIT/ML FlexPen INJECT 10 UNITS INTO THE SKIN BID  6  . timolol (TIMOPTIC-XR) 0.5 % ophthalmic gel-forming INSTILL 1 DROP IN EACH EYE Q DAY  4  . amLODipine (NORVASC) 5 MG tablet Take 1 tablet (5 mg total) by mouth daily. 30 tablet 5   No current facility-administered medications for this visit.    Physical Exam: Filed Vitals:   04/07/15 0952  BP: 138/80  Pulse: 60  Height: 6\' 3"  (1.905 m)  Weight: 178 lb (80.74 kg)    GEN- The patient is well appearing, alert and oriented x 3 today.   Head- normocephalic, atraumatic Eyes-  Sclera clear, conjunctiva pink Ears- hearing intact Oropharynx- clear Lungs- Clear to ausculation bilaterally, normal work of breathing Chest- pacemaker pocket is well healed Heart- Regular rate and rhythm (paced)  GI- soft, NT, ND, + BS Extremities- no clubbing, cyanosis, or edema  Pacemaker interrogation- reviewed in detail today,  See PACEART report  Assessment and Plan:  1. Permanent atrial flutter Rate controlled I have spoken with Dr Osborne Casco previously and given advanced age and patient preference, we will not anticoagulate this patient  2. Complete AV block Normal pacemaker function See Pace Art report No changes today  3. HTN Stable No change required today  He now is willing to try Merlin Return to see EP NP in 1 year  Thompson Grayer MD, [facility] 04/07/2015 10:40 AM

## 2015-04-07 NOTE — Patient Instructions (Addendum)
Medication Instructions:  Your physician recommends that you continue on your current medications as directed. Please refer to the Current Medication list given to you today.   Labwork: None ordered   Testing/Procedures: None ordered   Follow-Up: Your physician wants you to follow-up in: 12 months with Chanetta Marshall, NP You will receive a reminder letter in the mail two months in advance. If you don't receive a letter, please call our office to schedule the follow-up appointment.  Remote monitoring is used to monitor your Pacemaker  from home. This monitoring reduces the number of office visits required to check your device to one time per year. It allows [place] to keep an eye on the functioning of your device to ensure it is working properly. You are scheduled for a device check from home on 07/07/15. You may send your transmission at any time that day. If you have a wireless device, the transmission will be sent automatically. After your physician reviews your transmission, you will receive a postcard with your next transmission date.     Any Other Special Instructions Will Be Listed Below (If Applicable).

## 2015-06-02 ENCOUNTER — Other Ambulatory Visit: Payer: Self-pay | Admitting: Internal Medicine

## 2015-06-12 DIAGNOSIS — Z6822 Body mass index (BMI) 22.0-22.9, adult: Secondary | ICD-10-CM | POA: Diagnosis not present

## 2015-06-12 DIAGNOSIS — M6588 Other synovitis and tenosynovitis, other site: Secondary | ICD-10-CM | POA: Diagnosis not present

## 2015-07-07 ENCOUNTER — Encounter: Payer: Commercial Managed Care - HMO | Admitting: *Deleted

## 2015-07-09 ENCOUNTER — Encounter: Payer: Self-pay | Admitting: Cardiology

## 2015-08-05 DIAGNOSIS — I10 Essential (primary) hypertension: Secondary | ICD-10-CM | POA: Diagnosis not present

## 2015-08-05 DIAGNOSIS — Z125 Encounter for screening for malignant neoplasm of prostate: Secondary | ICD-10-CM | POA: Diagnosis not present

## 2015-08-05 DIAGNOSIS — E1129 Type 2 diabetes mellitus with other diabetic kidney complication: Secondary | ICD-10-CM | POA: Diagnosis not present

## 2015-08-12 DIAGNOSIS — D631 Anemia in chronic kidney disease: Secondary | ICD-10-CM | POA: Diagnosis not present

## 2015-08-12 DIAGNOSIS — Z0000 Encounter for general adult medical examination without abnormal findings: Secondary | ICD-10-CM | POA: Diagnosis not present

## 2015-08-12 DIAGNOSIS — R413 Other amnesia: Secondary | ICD-10-CM | POA: Diagnosis not present

## 2015-08-12 DIAGNOSIS — C61 Malignant neoplasm of prostate: Secondary | ICD-10-CM | POA: Diagnosis not present

## 2015-08-12 DIAGNOSIS — I4892 Unspecified atrial flutter: Secondary | ICD-10-CM | POA: Diagnosis not present

## 2015-08-12 DIAGNOSIS — D692 Other nonthrombocytopenic purpura: Secondary | ICD-10-CM | POA: Diagnosis not present

## 2015-08-12 DIAGNOSIS — H409 Unspecified glaucoma: Secondary | ICD-10-CM | POA: Diagnosis not present

## 2015-08-12 DIAGNOSIS — D72819 Decreased white blood cell count, unspecified: Secondary | ICD-10-CM | POA: Diagnosis not present

## 2015-08-12 DIAGNOSIS — E1129 Type 2 diabetes mellitus with other diabetic kidney complication: Secondary | ICD-10-CM | POA: Diagnosis not present

## 2015-09-02 DIAGNOSIS — H401113 Primary open-angle glaucoma, right eye, severe stage: Secondary | ICD-10-CM | POA: Diagnosis not present

## 2015-09-02 DIAGNOSIS — H401123 Primary open-angle glaucoma, left eye, severe stage: Secondary | ICD-10-CM | POA: Diagnosis not present

## 2015-09-04 ENCOUNTER — Encounter: Payer: Self-pay | Admitting: Internal Medicine

## 2015-09-04 ENCOUNTER — Ambulatory Visit (INDEPENDENT_AMBULATORY_CARE_PROVIDER_SITE_OTHER): Payer: Commercial Managed Care - HMO | Admitting: *Deleted

## 2015-09-04 DIAGNOSIS — I442 Atrioventricular block, complete: Secondary | ICD-10-CM

## 2015-09-04 NOTE — Progress Notes (Signed)
Pacemaker check in clinic. Normal device function. Thresholds, sensing, impedances consistent with previous measurements. Device programmed to maximize longevity. No mode switch or high ventricular rates noted. Device programmed at appropriate safety margins. Histogram distribution appropriate for patient activity level. Device programmed to optimize intrinsic conduction. Pt reports intermittent "vibrations" at pacemaker site. No identifiable cause determined related to pacemaker. Unable to reproduce. Estimated longevity 11.0-11.19yrs. Will re-order merlin box. Merlin check on 6/22 and ROV with AS on 10/24

## 2015-09-05 LAB — CUP PACEART INCLINIC DEVICE CHECK
Brady Statistic RA Percent Paced: 0 %
Date Time Interrogation Session: 20170323161325
Implantable Lead Location: 753859
Implantable Lead Model: 1948
Lead Channel Impedance Value: 562.5 Ohm
Lead Channel Pacing Threshold Pulse Width: 0.5 ms
MDC IDC LEAD IMPLANT DT: 20120522
MDC IDC LEAD IMPLANT DT: 20120522
MDC IDC LEAD LOCATION: 753860
MDC IDC MSMT BATTERY REMAINING LONGEVITY: 133.2
MDC IDC MSMT BATTERY VOLTAGE: 2.95 V
MDC IDC MSMT LEADCHNL RV PACING THRESHOLD AMPLITUDE: 0.5 V
MDC IDC SET LEADCHNL RV PACING AMPLITUDE: 0.625
MDC IDC SET LEADCHNL RV PACING PULSEWIDTH: 0.5 ms
MDC IDC SET LEADCHNL RV SENSING SENSITIVITY: 2 mV
MDC IDC STAT BRADY RV PERCENT PACED: 99.85 %
Pulse Gen Serial Number: 2660317

## 2015-09-11 ENCOUNTER — Telehealth: Payer: Self-pay | Admitting: *Deleted

## 2015-09-11 NOTE — Telephone Encounter (Signed)
Attempted to reach patient, but he was not available.  Per family member, he will be back around 10:30am.  Will try again.

## 2015-09-12 NOTE — Telephone Encounter (Signed)
LMOM advising patient that a new Location manager was ordered for him and that when he receives it he should call the phone number listed on the box for setup assistance.  Gave device clinic phone number for any questions/concerns.

## 2015-12-04 ENCOUNTER — Encounter: Payer: Commercial Managed Care - HMO | Admitting: *Deleted

## 2015-12-04 ENCOUNTER — Telehealth: Payer: Self-pay | Admitting: Cardiology

## 2015-12-04 NOTE — Telephone Encounter (Signed)
LMOVM reminding pt to send remote transmission.   

## 2015-12-05 ENCOUNTER — Encounter: Payer: Self-pay | Admitting: Cardiology

## 2016-05-21 DIAGNOSIS — M179 Osteoarthritis of knee, unspecified: Secondary | ICD-10-CM | POA: Diagnosis not present

## 2016-05-21 DIAGNOSIS — Z6822 Body mass index (BMI) 22.0-22.9, adult: Secondary | ICD-10-CM | POA: Diagnosis not present

## 2016-05-21 DIAGNOSIS — M545 Low back pain: Secondary | ICD-10-CM | POA: Diagnosis not present

## 2016-08-11 DIAGNOSIS — C61 Malignant neoplasm of prostate: Secondary | ICD-10-CM | POA: Diagnosis not present

## 2016-08-11 DIAGNOSIS — E1129 Type 2 diabetes mellitus with other diabetic kidney complication: Secondary | ICD-10-CM | POA: Diagnosis not present

## 2016-08-11 DIAGNOSIS — E7800 Pure hypercholesterolemia, unspecified: Secondary | ICD-10-CM | POA: Diagnosis not present

## 2016-08-11 DIAGNOSIS — Z125 Encounter for screening for malignant neoplasm of prostate: Secondary | ICD-10-CM | POA: Diagnosis not present

## 2016-08-18 DIAGNOSIS — D72819 Decreased white blood cell count, unspecified: Secondary | ICD-10-CM | POA: Diagnosis not present

## 2016-08-18 DIAGNOSIS — I10 Essential (primary) hypertension: Secondary | ICD-10-CM | POA: Diagnosis not present

## 2016-08-18 DIAGNOSIS — R001 Bradycardia, unspecified: Secondary | ICD-10-CM | POA: Diagnosis not present

## 2016-08-18 DIAGNOSIS — E7800 Pure hypercholesterolemia, unspecified: Secondary | ICD-10-CM | POA: Diagnosis not present

## 2016-08-18 DIAGNOSIS — Z794 Long term (current) use of insulin: Secondary | ICD-10-CM | POA: Diagnosis not present

## 2016-08-18 DIAGNOSIS — Z0000 Encounter for general adult medical examination without abnormal findings: Secondary | ICD-10-CM | POA: Diagnosis not present

## 2016-08-18 DIAGNOSIS — N529 Male erectile dysfunction, unspecified: Secondary | ICD-10-CM | POA: Diagnosis not present

## 2016-08-18 DIAGNOSIS — D692 Other nonthrombocytopenic purpura: Secondary | ICD-10-CM | POA: Diagnosis not present

## 2016-08-18 DIAGNOSIS — E1129 Type 2 diabetes mellitus with other diabetic kidney complication: Secondary | ICD-10-CM | POA: Diagnosis not present

## 2016-08-25 ENCOUNTER — Other Ambulatory Visit: Payer: Self-pay | Admitting: Internal Medicine

## 2016-09-02 ENCOUNTER — Telehealth: Payer: Self-pay | Admitting: *Deleted

## 2016-09-02 ENCOUNTER — Ambulatory Visit (INDEPENDENT_AMBULATORY_CARE_PROVIDER_SITE_OTHER): Payer: Medicare HMO | Admitting: *Deleted

## 2016-09-02 DIAGNOSIS — I442 Atrioventricular block, complete: Secondary | ICD-10-CM

## 2016-09-02 LAB — CUP PACEART INCLINIC DEVICE CHECK
Brady Statistic RA Percent Paced: 0 %
Brady Statistic RV Percent Paced: 98 %
Date Time Interrogation Session: 20180322165207
Implantable Lead Location: 753859
Implantable Pulse Generator Implant Date: 20120522
Lead Channel Impedance Value: 537.5 Ohm
Lead Channel Pacing Threshold Amplitude: 0.375 V
Lead Channel Pacing Threshold Pulse Width: 0.5 ms
Lead Channel Setting Pacing Amplitude: 2.5 V
MDC IDC LEAD IMPLANT DT: 20120522
MDC IDC LEAD IMPLANT DT: 20120522
MDC IDC LEAD LOCATION: 753860
MDC IDC MSMT BATTERY VOLTAGE: 2.95 V
MDC IDC MSMT LEADCHNL RV SENSING INTR AMPL: 12 mV
MDC IDC SET LEADCHNL RV PACING PULSEWIDTH: 0.5 ms
MDC IDC SET LEADCHNL RV SENSING SENSITIVITY: 2 mV
Pulse Gen Model: 2210
Pulse Gen Serial Number: 2660317

## 2016-09-02 NOTE — Telephone Encounter (Signed)
Mr. Huttner calling due to a "buzzing" feeling at his PPM site. He reports that he feels fine. He admits that he's overdue to see Dr. Rayann Heman but needs PPM checked. He will come to [facility] today at 3:30 for Pacer Check.

## 2016-09-03 NOTE — Progress Notes (Signed)
Pacemaker check in clinic. Normal device function. Threshold, sensing, and impedance consistent with previous measurements. Device programmed to maximize longevity. No high ventricular rates noted. Device programmed at appropriate safety margins. Histogram distribution appropriate for patient activity level. Device programmed to optimize intrinsic conduction. Estimated longevity 11.0-11.6 years. Patient will follow up with AS in 3 months for yearly.

## 2016-10-06 ENCOUNTER — Encounter: Payer: Self-pay | Admitting: Nurse Practitioner

## 2016-10-20 ENCOUNTER — Encounter: Payer: Self-pay | Admitting: Nurse Practitioner

## 2016-11-11 ENCOUNTER — Encounter: Payer: Medicare HMO | Admitting: Nurse Practitioner

## 2016-11-19 ENCOUNTER — Other Ambulatory Visit: Payer: Self-pay | Admitting: Internal Medicine

## 2016-11-19 NOTE — Telephone Encounter (Signed)
carvedilol (COREG) 12.5 MG tablet  Medication  Date: 08/26/2016 Department: [address] Office Ordering/Authorizing: Thompson Grayer, MD  Order Providers   Prescribing Provider Encounter Provider  Thompson Grayer, MD Thompson Grayer, MD  Medication Detail    Disp Refills Start End   carvedilol (COREG) 12.5 MG tablet 60 tablet 0 08/26/2016    Sig - Route: Take 1 tablet (12.5 mg total) by mouth 2 (two) times daily with a meal. PLEASE SCHEDULE FOLLOW UP APPOINTMENT FOR MORE REFILLS - Oral   Notes to Pharmacy: PATIENT NEEDS TO SCHEDULE FOLLOW UP APPOINTMENT FOR MORE REFILLS   E-Prescribing Status: Receipt confirmed by pharmacy (08/26/2016 3:27 PM EDT)   Pharmacy   [id number] - [place], [address]

## 2016-12-31 ENCOUNTER — Other Ambulatory Visit: Payer: Self-pay | Admitting: Internal Medicine

## 2017-01-06 DIAGNOSIS — Z6821 Body mass index (BMI) 21.0-21.9, adult: Secondary | ICD-10-CM | POA: Diagnosis not present

## 2017-01-06 DIAGNOSIS — I10 Essential (primary) hypertension: Secondary | ICD-10-CM | POA: Diagnosis not present

## 2017-01-06 DIAGNOSIS — C61 Malignant neoplasm of prostate: Secondary | ICD-10-CM | POA: Diagnosis not present

## 2017-01-06 DIAGNOSIS — M545 Low back pain: Secondary | ICD-10-CM | POA: Diagnosis not present

## 2017-02-24 DIAGNOSIS — R413 Other amnesia: Secondary | ICD-10-CM | POA: Diagnosis not present

## 2017-02-24 DIAGNOSIS — C61 Malignant neoplasm of prostate: Secondary | ICD-10-CM | POA: Diagnosis not present

## 2017-02-24 DIAGNOSIS — M545 Low back pain: Secondary | ICD-10-CM | POA: Diagnosis not present

## 2017-02-24 DIAGNOSIS — D696 Thrombocytopenia, unspecified: Secondary | ICD-10-CM | POA: Diagnosis not present

## 2017-02-24 DIAGNOSIS — R808 Other proteinuria: Secondary | ICD-10-CM | POA: Diagnosis not present

## 2017-02-24 DIAGNOSIS — Z794 Long term (current) use of insulin: Secondary | ICD-10-CM | POA: Diagnosis not present

## 2017-02-24 DIAGNOSIS — D631 Anemia in chronic kidney disease: Secondary | ICD-10-CM | POA: Diagnosis not present

## 2017-02-24 DIAGNOSIS — N183 Chronic kidney disease, stage 3 (moderate): Secondary | ICD-10-CM | POA: Diagnosis not present

## 2017-02-24 DIAGNOSIS — E1129 Type 2 diabetes mellitus with other diabetic kidney complication: Secondary | ICD-10-CM | POA: Diagnosis not present

## 2017-04-04 ENCOUNTER — Encounter: Payer: Medicare HMO | Admitting: Internal Medicine

## 2017-04-04 ENCOUNTER — Other Ambulatory Visit: Payer: Self-pay | Admitting: Internal Medicine

## 2017-06-20 ENCOUNTER — Encounter: Payer: Medicare HMO | Admitting: Internal Medicine

## 2017-06-21 ENCOUNTER — Encounter: Payer: Self-pay | Admitting: Internal Medicine

## 2017-08-15 DIAGNOSIS — E1129 Type 2 diabetes mellitus with other diabetic kidney complication: Secondary | ICD-10-CM | POA: Diagnosis not present

## 2017-08-15 DIAGNOSIS — R82998 Other abnormal findings in urine: Secondary | ICD-10-CM | POA: Diagnosis not present

## 2017-08-15 DIAGNOSIS — Z125 Encounter for screening for malignant neoplasm of prostate: Secondary | ICD-10-CM | POA: Diagnosis not present

## 2017-08-15 DIAGNOSIS — N183 Chronic kidney disease, stage 3 (moderate): Secondary | ICD-10-CM | POA: Diagnosis not present

## 2017-08-15 DIAGNOSIS — E7800 Pure hypercholesterolemia, unspecified: Secondary | ICD-10-CM | POA: Diagnosis not present

## 2017-08-22 DIAGNOSIS — N183 Chronic kidney disease, stage 3 (moderate): Secondary | ICD-10-CM | POA: Diagnosis not present

## 2017-08-22 DIAGNOSIS — H4089 Other specified glaucoma: Secondary | ICD-10-CM | POA: Diagnosis not present

## 2017-08-22 DIAGNOSIS — D631 Anemia in chronic kidney disease: Secondary | ICD-10-CM | POA: Diagnosis not present

## 2017-08-22 DIAGNOSIS — Z794 Long term (current) use of insulin: Secondary | ICD-10-CM | POA: Diagnosis not present

## 2017-08-22 DIAGNOSIS — Z0000 Encounter for general adult medical examination without abnormal findings: Secondary | ICD-10-CM | POA: Diagnosis not present

## 2017-08-22 DIAGNOSIS — D696 Thrombocytopenia, unspecified: Secondary | ICD-10-CM | POA: Diagnosis not present

## 2017-08-22 DIAGNOSIS — M545 Low back pain: Secondary | ICD-10-CM | POA: Diagnosis not present

## 2017-08-22 DIAGNOSIS — D72819 Decreased white blood cell count, unspecified: Secondary | ICD-10-CM | POA: Diagnosis not present

## 2017-08-22 DIAGNOSIS — E1129 Type 2 diabetes mellitus with other diabetic kidney complication: Secondary | ICD-10-CM | POA: Diagnosis not present

## 2017-11-22 ENCOUNTER — Telehealth: Payer: Self-pay | Admitting: Internal Medicine

## 2017-11-22 NOTE — Telephone Encounter (Signed)
Attempted outreach to caller.  No DPR on file.  If caller calls back-will advise life alert does not appear to require an order.  Appears companies provide service for a fee.  No further action at this time.

## 2017-11-22 NOTE — Telephone Encounter (Signed)
No DPR on file.  

## 2017-11-22 NOTE — Telephone Encounter (Signed)
New message    Angelita Ingles is pt neighbor and she is calling for him , patient needs a order for a lifealert button, he is having a lot of falls

## 2018-01-04 DIAGNOSIS — G4700 Insomnia, unspecified: Secondary | ICD-10-CM | POA: Diagnosis not present

## 2018-01-04 DIAGNOSIS — H409 Unspecified glaucoma: Secondary | ICD-10-CM | POA: Diagnosis not present

## 2018-01-04 DIAGNOSIS — Z0289 Encounter for other administrative examinations: Secondary | ICD-10-CM | POA: Diagnosis not present

## 2018-01-04 DIAGNOSIS — I10 Essential (primary) hypertension: Secondary | ICD-10-CM | POA: Diagnosis not present

## 2018-01-04 DIAGNOSIS — E1129 Type 2 diabetes mellitus with other diabetic kidney complication: Secondary | ICD-10-CM | POA: Diagnosis not present

## 2018-01-20 DIAGNOSIS — Z9181 History of falling: Secondary | ICD-10-CM | POA: Diagnosis not present

## 2018-01-20 DIAGNOSIS — M6281 Muscle weakness (generalized): Secondary | ICD-10-CM | POA: Diagnosis not present

## 2018-01-20 DIAGNOSIS — M545 Low back pain: Secondary | ICD-10-CM | POA: Diagnosis not present

## 2018-01-20 DIAGNOSIS — Z681 Body mass index (BMI) 19 or less, adult: Secondary | ICD-10-CM | POA: Diagnosis not present

## 2018-01-20 DIAGNOSIS — C61 Malignant neoplasm of prostate: Secondary | ICD-10-CM | POA: Diagnosis not present

## 2018-01-27 ENCOUNTER — Emergency Department (HOSPITAL_COMMUNITY)
Admission: EM | Admit: 2018-01-27 | Discharge: 2018-01-27 | Disposition: A | Discharge status: Home / Self Care | Payer: Medicare HMO | Attending: Emergency Medicine | Admitting: Emergency Medicine

## 2018-01-27 ENCOUNTER — Emergency Department (HOSPITAL_COMMUNITY): Payer: Medicare HMO

## 2018-01-27 ENCOUNTER — Other Ambulatory Visit: Payer: Self-pay

## 2018-01-27 ENCOUNTER — Encounter (HOSPITAL_COMMUNITY): Payer: Self-pay

## 2018-01-27 DIAGNOSIS — R1111 Vomiting without nausea: Secondary | ICD-10-CM | POA: Diagnosis not present

## 2018-01-27 DIAGNOSIS — R110 Nausea: Secondary | ICD-10-CM | POA: Insufficient documentation

## 2018-01-27 DIAGNOSIS — Z87891 Personal history of nicotine dependence: Secondary | ICD-10-CM | POA: Diagnosis not present

## 2018-01-27 DIAGNOSIS — K5900 Constipation, unspecified: Secondary | ICD-10-CM | POA: Diagnosis not present

## 2018-01-27 DIAGNOSIS — I2510 Atherosclerotic heart disease of native coronary artery without angina pectoris: Secondary | ICD-10-CM | POA: Insufficient documentation

## 2018-01-27 DIAGNOSIS — Z8546 Personal history of malignant neoplasm of prostate: Secondary | ICD-10-CM | POA: Diagnosis not present

## 2018-01-27 DIAGNOSIS — R0902 Hypoxemia: Secondary | ICD-10-CM | POA: Diagnosis not present

## 2018-01-27 DIAGNOSIS — Z79899 Other long term (current) drug therapy: Secondary | ICD-10-CM | POA: Diagnosis not present

## 2018-01-27 DIAGNOSIS — Z950 Presence of cardiac pacemaker: Secondary | ICD-10-CM | POA: Insufficient documentation

## 2018-01-27 DIAGNOSIS — R531 Weakness: Secondary | ICD-10-CM | POA: Diagnosis not present

## 2018-01-27 DIAGNOSIS — R1084 Generalized abdominal pain: Secondary | ICD-10-CM | POA: Diagnosis not present

## 2018-01-27 DIAGNOSIS — R5383 Other fatigue: Secondary | ICD-10-CM | POA: Diagnosis not present

## 2018-01-27 DIAGNOSIS — I959 Hypotension, unspecified: Secondary | ICD-10-CM | POA: Diagnosis not present

## 2018-01-27 LAB — I-STAT TROPONIN, ED: Troponin i, poc: 0.02 ng/mL (ref 0.00–0.08)

## 2018-01-27 LAB — COMPREHENSIVE METABOLIC PANEL
ALK PHOS: 64 U/L (ref 38–126)
ALT: 10 U/L (ref 0–44)
AST: 19 U/L (ref 15–41)
Albumin: 3.5 g/dL (ref 3.5–5.0)
Anion gap: 9 (ref 5–15)
BILIRUBIN TOTAL: 0.4 mg/dL (ref 0.3–1.2)
BUN: 39 mg/dL — ABNORMAL HIGH (ref 8–23)
CALCIUM: 8.9 mg/dL (ref 8.9–10.3)
CO2: 20 mmol/L — ABNORMAL LOW (ref 22–32)
CREATININE: 1.97 mg/dL — AB (ref 0.61–1.24)
Chloride: 111 mmol/L (ref 98–111)
GFR calc Af Amer: 31 mL/min — ABNORMAL LOW (ref >60)
GFR calc non Af Amer: 26 mL/min — ABNORMAL LOW (ref >60)
GLUCOSE: 173 mg/dL — AB (ref 70–99)
Potassium: 5.2 mmol/L — ABNORMAL HIGH (ref 3.5–5.1)
SODIUM: 140 mmol/L (ref 135–145)
TOTAL PROTEIN: 6.8 g/dL (ref 6.5–8.1)

## 2018-01-27 LAB — URINALYSIS, ROUTINE W REFLEX MICROSCOPIC
Bacteria, UA: NONE SEEN
Bilirubin Urine: NEGATIVE
Glucose, UA: NEGATIVE mg/dL
Hgb urine dipstick: NEGATIVE
KETONES UR: NEGATIVE mg/dL
Leukocytes, UA: NEGATIVE
Nitrite: NEGATIVE
PH: 5 (ref 5.0–8.0)
Protein, ur: 30 mg/dL — AB
SPECIFIC GRAVITY, URINE: 1.015 (ref 1.005–1.030)

## 2018-01-27 LAB — CBC
HCT: 35.4 % — ABNORMAL LOW (ref 39.0–52.0)
Hemoglobin: 11.9 g/dL — ABNORMAL LOW (ref 13.0–17.0)
MCH: 29.8 pg (ref 26.0–34.0)
MCHC: 33.6 g/dL (ref 30.0–36.0)
MCV: 88.5 fL (ref 78.0–100.0)
PLATELETS: 167 10*3/uL (ref 150–400)
RBC: 4 MIL/uL — ABNORMAL LOW (ref 4.22–5.81)
RDW: 12.6 % (ref 11.5–15.5)
WBC: 3.4 10*3/uL — ABNORMAL LOW (ref 4.0–10.5)

## 2018-01-27 LAB — LIPASE, BLOOD: Lipase: 26 U/L (ref 11–51)

## 2018-01-27 MED ORDER — SODIUM CHLORIDE 0.9 % IV BOLUS
500.0000 mL | Freq: Once | INTRAVENOUS | Status: AC
  Administered 2018-01-27: 500 mL via INTRAVENOUS

## 2018-01-27 MED ORDER — POLYETHYLENE GLYCOL 3350 17 G PO PACK: 17.0000 g | PACK | Freq: Every day | ORAL | 0 refills | Status: AC

## 2018-01-27 NOTE — ED Triage Notes (Addendum)
Patient arrived by EMS. Patient came from home by EMS. Patient c/o of generalized weakness and constipation and abd pain. Emesis x 1 per EMS. Initial BP for EMS was 70 systolic. Pt received 500 mL of IV NS. Pt has a 20 gauge IV in lft forearm. Current BP 125/69 per, pulse 60 , CBG 248 per EMS. Pt has ventricular pacemaker- 60 rate.

## 2018-01-27 NOTE — ED Notes (Signed)
Patient denies pain and is resting comfortably.  

## 2018-01-27 NOTE — ED Provider Notes (Signed)
[facility] DEPT Provider Note   CSN: [id number] Arrival date & time: 01/27/18  1422     History   Chief Complaint Chief Complaint  Patient presents with  . Weakness  . Abdominal Pain    HPI Mario Warner. is a 84 y.o. male.  The history is provided by the patient.  Abdominal Pain   This is a new problem. The current episode started 6 to 12 hours ago. The problem has been resolved. Associated with: with bowel movement. The pain is located in the generalized abdominal region. The quality of the pain is aching and dull. The pain is at a severity of 1/10. The pain is mild. Associated symptoms include nausea and constipation. Pertinent negatives include anorexia, fever, belching, diarrhea, flatus, hematochezia, melena, vomiting, dysuria, frequency, hematuria, headaches and arthralgias. Nothing aggravates the symptoms. Nothing relieves the symptoms. His past medical history does not include GERD.    Past Medical History:  Diagnosis Date  . Atrial flutter ([place])    chronically anticoagulated with coumadin  . Benign prostatic hypertrophy   . BENIGN PROSTATIC HYPERTROPHY, HX OF 04/01/2010   Qualifier: Diagnosis of  By: Selena Batten CMA, [place]    . Bradycardia    s/p PPM by Dr Rayann Heman  . Bright red blood per rectum 12/26/2011  . CAD (coronary artery disease)   . Complete heart block ([place]) 02/05/2011  . DEGENERATIVE JOINT DISEASE 04/01/2010   Qualifier: Diagnosis of  By: Selena Batten CMA, Jewel    . [place] DISEASE, LUMBAR 04/01/2010   Qualifier: Diagnosis of  By: Selena Batten CMA, Jewel    . DJD (degenerative joint disease)   . DJD (degenerative joint disease)   . DM (diabetes mellitus) ([place])   . DM (diabetes mellitus) ([place])   . Glaucoma   . Gout   . HTN (hypertension)   . Hypercholesterolemia   . Hyperlipidemia   . Impotence   . Lumbar back pain   . Lumbar disc disease   . OA (osteoarthritis) of knee   . Pacemaker   . PC (prostate cancer) ([place])   . Peptic ulcer     . Subdural hematoma ([place]) 12/24/2011  . Syncope 12/24/2011    Patient Active Problem List   Diagnosis Date Noted  . Bright red blood per rectum 12/26/2011  . Syncope 12/24/2011  . Subdural hematoma ([place]) 12/24/2011  . Complete heart block ([place]) 02/05/2011  . Atrial flutter ([place]) 11/10/2010  . DM 04/01/2010  . HYPERLIPIDEMIA 04/01/2010  . GLAUCOMA 04/01/2010  . Essential hypertension 04/01/2010  . DEGENERATIVE JOINT DISEASE 04/01/2010  . [place] DISEASE, LUMBAR 04/01/2010  . BENIGN PROSTATIC HYPERTROPHY, HX OF 04/01/2010    Past Surgical History:  Procedure Laterality Date  . CATARACT EXTRACTION  12/2010  . HEMORRHOID SURGERY    . INSERT / REPLACE / REMOVE PACEMAKER    . PACEMAKER INSERTION  5/12   by Dr Rayann Heman - St. Jude device  . TRANSURETHRAL RESECTION OF PROSTATE          Home Medications    Prior to Admission medications   Medication Sig Start Date End Date Taking? Authorizing Provider  amLODipine (NORVASC) 5 MG tablet Take 1 tablet (5 mg total) by mouth daily. 12/27/11 01/27/18 Yes Tisovec, Fransico Him, MD  atorvastatin (LIPITOR) 20 MG tablet Take 20 mg by mouth daily.     Yes [provider]  carvedilol (COREG) 12.5 MG tablet TAKE 1 TABLET BY MOUTH TWICE DAILY, WITH A MEAL 04/04/17  Yes Thompson Grayer, MD  COMBIGAN 0.2-0.5 % ophthalmic solution INSTILL 1 DROP IN OU BID 01/27/15  Yes [provider]  timolol (TIMOPTIC-XR) 0.5 % ophthalmic gel-forming INSTILL 1 DROP IN [facility] EYE Q DAY 01/16/15  Yes [provider]  polyethylene glycol (MIRALAX / GLYCOLAX) packet Take 17 g by mouth daily. 01/27/18   Lennice Sites, DO    Family History Family History  Problem Relation Age of Onset  . Diabetes Mother   . Blindness Mother   . Diabetes Father     Social History Social History   Tobacco Use  . Smoking status: Former Smoker    Last attempt to quit: 02/05/1971    Years since quitting: 47.0  . Smokeless tobacco: Never Used  Substance Use Topics   . Alcohol use: No  . Drug use: No     Allergies   Patient has no known allergies.   Review of Systems Review of Systems  Constitutional: Positive for fatigue. Negative for chills and fever.  HENT: Negative for ear pain and sore throat.   Eyes: Negative for pain and visual disturbance.  Respiratory: Negative for cough and shortness of breath.   Cardiovascular: Negative for chest pain and palpitations.  Gastrointestinal: Positive for abdominal pain, constipation and nausea. Negative for anorexia, diarrhea, flatus, hematochezia, melena and vomiting.  Genitourinary: Negative for dysuria, frequency and hematuria.  Musculoskeletal: Negative for arthralgias and back pain.  Skin: Negative for color change and rash.  Neurological: Negative for seizures, syncope and headaches.  All other systems reviewed and are negative.    Physical Exam Updated Vital Signs  ED Triage Vitals  Enc Vitals Group     BP 01/27/18 1448 (!) 147/76     Pulse Rate 01/27/18 1448 60     Resp 01/27/18 1448 18     Temp 01/27/18 1448 97.6 F (36.4 C)     Temp Source 01/27/18 1448 Oral     SpO2 01/27/18 1448 98 %     Weight 01/27/18 1442 177 lb 14.6 oz (80.7 kg)     Height 01/27/18 1442 6\' 3"  (1.905 m)     Head Circumference --      Peak Flow --      Pain Score --      Pain Loc --      Pain Edu? --      Excl. in [place]? --     Physical Exam  Constitutional: He appears well-developed and well-nourished.  HENT:  Head: Normocephalic and atraumatic.  Mouth/Throat: Oropharynx is clear and moist. No oropharyngeal exudate.  Eyes: Pupils are equal, round, and reactive to light. Conjunctivae and EOM are normal.  Neck: Neck supple.  Cardiovascular: Normal rate, regular rhythm, normal heart sounds and intact distal pulses.  No murmur heard. Pulmonary/Chest: Effort normal and breath sounds normal. No respiratory distress.  Abdominal: Soft. Normal appearance. There is generalized tenderness. There is no rigidity,  no rebound, no guarding and no CVA tenderness. No hernia.  Musculoskeletal: He exhibits no edema.  Neurological: He is alert.  Skin: Skin is warm and dry. Capillary refill takes less than 2 seconds.  Psychiatric: He has a normal mood and affect.  Nursing note and vitals reviewed.    ED Treatments / Results  Labs (all labs ordered are listed, but only abnormal results are displayed) Labs Reviewed  COMPREHENSIVE METABOLIC PANEL - Abnormal; Notable for the following components:      Result Value   Potassium 5.2 (*)    CO2 20 (*)    Glucose,  Bld 173 (*)    BUN 39 (*)    Creatinine, Ser 1.97 (*)    GFR calc non Af Amer 26 (*)    GFR calc Af Amer 31 (*)    All other components within normal limits  CBC - Abnormal; Notable for the following components:   WBC 3.4 (*)    RBC 4.00 (*)    Hemoglobin 11.9 (*)    HCT 35.4 (*)    All other components within normal limits  URINALYSIS, ROUTINE W REFLEX MICROSCOPIC - Abnormal; Notable for the following components:   Protein, ur 30 (*)    All other components within normal limits  LIPASE, BLOOD  I-STAT TROPONIN, ED    EKG EKG Interpretation  Date/Time:  Friday January 27 2018 14:51:44 EDT Ventricular Rate:  60 PR Interval:    QRS Duration: 155 QT Interval:  630 QTC Calculation: 630 R Axis:   -83 Text Interpretation:  Atrial-sensed ventricular-paced rhythm No further analysis attempted due to paced rhythm Confirmed by Lennice Sites [phone]) on 01/27/2018 3:04:56 PM   Radiology Ct Abdomen Pelvis Wo Contrast  Result Date: 01/27/2018 CLINICAL DATA:  Generalized weakness with constipation and abdominal pain. Vomiting x1. EXAM: CT ABDOMEN AND PELVIS WITHOUT CONTRAST TECHNIQUE: Multidetector CT imaging of the abdomen and pelvis was performed following the standard protocol without IV contrast. COMPARISON:  None. FINDINGS: Lower chest: Cardiac pacer leads are present. There is calcified plaque over the 3 vessel coronary arteries. Subtle  subpleural interstitial thickening over the lung bases. Hepatobiliary: Gallbladder, liver and biliary tree are within normal. Pancreas: Normal. Spleen: Normal. Adrenals/Urinary Tract: Adrenal glands are normal. Kidneys are normal size without hydronephrosis or nephrolithiasis. Ureters and bladder are normal. Stomach/Bowel: Stomach and small bowel are within normal. Appendix is normal. Colon is within normal as there is mild to moderate fecal retention over the rectum. Vascular/Lymphatic: Mild calcified plaque over the abdominal aorta and iliac arteries. No evidence adenopathy. Reproductive: Normal. Two metallic densities are present over the prostate. Other: No free fluid or focal inflammatory change. Musculoskeletal: Degenerate change of the spine. Focal sclerotic lesion along the left side of the L2 vertebral body. Schmorl's no versus hemangioma adjacent the superior endplate of L1. Focal sclerosis over the right iliac bone just above the acetabulum. Minimal focal sclerosis over the left iliac bone above the acetabulum. These findings may be due to metastatic disease. IMPRESSION: No acute findings in the abdomen/pelvis. Mild to moderate fecal retention over the rectum. Aortic Atherosclerosis (ICD10-I70.0). Atherosclerotic coronary artery disease. Sclerotic lesions over L2 as well as the iliac bones as described. Findings suggest metastatic disease. Electronically Signed   By: Marin Olp M.D.   On: 01/27/2018 17:03   Dg Chest Portable 1 View  Result Date: 01/27/2018 CLINICAL DATA:  Generalized weakness. EXAM: PORTABLE CHEST 1 VIEW COMPARISON:  Radiographs of December 24, 2011. FINDINGS: The heart size and mediastinal contours are within normal limits. Both lungs are clear. No pneumothorax or pleural effusion is noted. Left-sided pacemaker is unchanged in position. The visualized skeletal structures are unremarkable. IMPRESSION: No active disease. Electronically Signed   By: Marijo Conception, M.D.   On: 01/27/2018  15:46    Procedures Procedures (including critical care time)  Medications Ordered in ED Medications  sodium chloride 0.9 % bolus 500 mL (0 mLs Intravenous Stopped 01/27/18 1717)     Initial Impression / Assessment and Plan / ED Course  I have reviewed the triage vital signs and the nursing notes.  Pertinent labs &  imaging results that were available during my care of the patient were reviewed by me and considered in my medical decision making (see chart for details).     Mario Warner. is a 84 year old male with history of arrhythmias status post pacemaker, diabetes, coronary artery disease, high cholesterol who presents to the ED with abdominal pain.  Patient with normal vitals.  No fever.  Patient states that he has had some lower abdominal pain for the last several days.  Has had difficulty with bowel movements.  Patient denies any chest pain, nausea, vomiting.  Patient is passing gas.  EKG in triage shows no ischemic changes.  Unchanged from prior EKGs.  Patient does admit to some generalized weakness but is fairly nonspecific.  He denies any urinary symptoms.  Patient has some mild tenderness on abdominal exam but otherwise exam is unremarkable.  Lab work and imaging were ordered.  Patient given normal saline bolus.  Patient with no significant electrolyte abnormality, acute kidney injury, anemia.  Creatinine is at baseline.  No concern for GI bleed.  Patient with no signs to suggest urinary tract infection.  Troponin within normal limits.  No concern for cardiac process at this time.  Lipase within normal limits doubt pancreatitis.  CT abdomen and pelvis overall unremarkable.  There is some stool burden.  Chest x-ray showed no signs of pneumonia, pneumothorax, pleural effusion.  Patient likely with symptoms secondary to constipation and was prescribed MiraLAX.  Discharged from ED in good condition, recommend follow-up with primary care doctor and told to return to the ED if symptoms  worsen.  This chart was dictated using voice recognition software.  Despite best efforts to proofread,  errors can occur which can change the documentation meaning.   Final Clinical Impressions(s) / ED Diagnoses   Final diagnoses:  Constipation, unspecified constipation type  Generalized weakness    ED Discharge Orders         Ordered    polyethylene glycol (MIRALAX / GLYCOLAX) packet  Daily     01/27/18 1935           Lennice Sites, DO 01/27/18 2317

## 2018-01-27 NOTE — ED Notes (Signed)
Bed: WA06 Expected date:  Expected time:  Means of arrival:  Comments: 84 yo weakness, hypotension

## 2018-01-28 ENCOUNTER — Other Ambulatory Visit: Payer: Self-pay | Admitting: Internal Medicine

## 2018-01-30 NOTE — Telephone Encounter (Signed)
Pt was seen last 04/07/2015. Please advice on refill request.

## 2018-02-20 NOTE — Progress Notes (Deleted)
Electrophysiology Office Note Date: 02/20/2018  ID:  Mario Bastos., DOB 08/06/1916, MRN [id number]  PCP: Haywood Pao, MD Electrophysiologist: Rayann Heman  CC: Pacemaker follow-up  Mario Krasinski. is a 84 y.o. male seen today for Dr Rayann Heman.  He presents today for routine electrophysiology followup.  Since last being seen in our clinic, the patient reports doing very well.  He denies chest pain, palpitations, dyspnea, PND, orthopnea, nausea, vomiting, dizziness, syncope, edema, weight gain, or early satiety.  Device History: STJ dual chamber PPM implanted 2012 for SSS   Past Medical History:  Diagnosis Date  . Atrial flutter ([place])    chronically anticoagulated with coumadin  . Benign prostatic hypertrophy   . BENIGN PROSTATIC HYPERTROPHY, HX OF 04/01/2010   Qualifier: Diagnosis of  By: Selena Batten CMA, Jewel    . Bradycardia    s/p PPM by Dr Rayann Heman  . Bright red blood per rectum 12/26/2011  . CAD (coronary artery disease)   . Complete heart block ([place]) 02/05/2011  . DEGENERATIVE JOINT DISEASE 04/01/2010   Qualifier: Diagnosis of  By: Selena Batten CMA, Jewel    . [place] DISEASE, LUMBAR 04/01/2010   Qualifier: Diagnosis of  By: Selena Batten CMA, Jewel    . DJD (degenerative joint disease)   . DJD (degenerative joint disease)   . DM (diabetes mellitus) ([place])   . DM (diabetes mellitus) ([place])   . Glaucoma   . Gout   . HTN (hypertension)   . Hypercholesterolemia   . Hyperlipidemia   . Impotence   . Lumbar back pain   . Lumbar disc disease   . OA (osteoarthritis) of knee   . Pacemaker   . PC (prostate cancer) ([place])   . Peptic ulcer   . Subdural hematoma ([place]) 12/24/2011  . Syncope 12/24/2011   Past Surgical History:  Procedure Laterality Date  . CATARACT EXTRACTION  12/2010  . HEMORRHOID SURGERY    . INSERT / REPLACE / REMOVE PACEMAKER    . PACEMAKER INSERTION  5/12   by Dr Rayann Heman - St. Jude device  . TRANSURETHRAL RESECTION OF PROSTATE      Current Outpatient Medications    Medication Sig Dispense Refill  . amLODipine (NORVASC) 5 MG tablet Take 1 tablet (5 mg total) by mouth daily. 30 tablet 5  . atorvastatin (LIPITOR) 20 MG tablet Take 20 mg by mouth daily.      . carvedilol (COREG) 12.5 MG tablet Take 1 tablet (12.5 mg total) by mouth 2 (two) times daily with a meal. 60 tablet 0  . COMBIGAN 0.2-0.5 % ophthalmic solution INSTILL 1 DROP IN OU BID  0  . polyethylene glycol (MIRALAX / GLYCOLAX) packet Take 17 g by mouth daily. 14 each 0  . timolol (TIMOPTIC-XR) 0.5 % ophthalmic gel-forming INSTILL 1 DROP IN EACH EYE Q DAY  4   No current facility-administered medications for this visit.     Allergies:   Patient has no known allergies.   Social History: Social History   Socioeconomic History  . Marital status: Widowed    Spouse name: Not on file  . Number of children: 1  . Years of education: Not on file  . Highest education level: Not on file  Occupational History  . Not on file  Social Needs  . Financial resource strain: Not on file  . Food insecurity:    Worry: Not on file    Inability: Not on file  . Transportation needs:    Medical: Not on file  Non-medical: Not on file  Tobacco Use  . Smoking status: Former Smoker    Last attempt to quit: 02/05/1971    Years since quitting: 47.0  . Smokeless tobacco: Never Used  Substance and Sexual Activity  . Alcohol use: No  . Drug use: No  . Sexual activity: Never  Lifestyle  . Physical activity:    Days per week: Not on file    Minutes per session: Not on file  . Stress: Not on file  Relationships  . Social connections:    Talks on phone: Not on file    Gets together: Not on file    Attends religious service: Not on file    Active member of club or organization: Not on file    Attends meetings of clubs or organizations: Not on file    Relationship status: Not on file  . Intimate partner violence:    Fear of current or ex partner: Not on file    Emotionally abused: Not on file     Physically abused: Not on file    Forced sexual activity: Not on file  Other Topics Concern  . Not on file  Social History Narrative   Lives alone.      Family History: Family History  Problem Relation Age of Onset  . Diabetes Mother   . Blindness Mother   . Diabetes Father      Review of Systems: All other systems reviewed and are otherwise negative except as noted above.   Physical Exam: VS:  There were no vitals taken for this visit. , BMI There is no height or weight on file to calculate BMI.  GEN- The patient is well appearing, alert and oriented x 3 today.   HEENT: normocephalic, atraumatic; sclera clear, conjunctiva pink; hearing intact; oropharynx clear; neck supple  Lungs- Clear to ausculation bilaterally, normal work of breathing.  No wheezes, rales, rhonchi Heart- Regular rate and rhythm, no murmurs, rubs or gallops  GI- soft, non-tender, non-distended, bowel sounds present  Extremities- no clubbing, cyanosis, or edema  MS- no significant deformity or atrophy Skin- warm and dry, no rash or lesion; PPM pocket well healed Psych- euthymic mood, full affect Neuro- strength and sensation are intact  PPM Interrogation- reviewed in detail today,  See PACEART report  EKG:  EKG is not ordered today.  Recent Labs: 01/27/2018: ALT 10; BUN 39; Creatinine, Ser 1.97; Hemoglobin 11.9; Platelets 167; Potassium 5.2; Sodium 140   Wt Readings from Last 3 Encounters:  01/27/18 177 lb 14.6 oz (80.7 kg)  04/07/15 178 lb (80.7 kg)  10/03/13 178 lb (80.7 kg)     Other studies Reviewed: Additional studies/ records that were reviewed today include: Dr Jackalyn Lombard office notes  Assessment and Plan:  1.  Complete heart block Normal PPM function See Pace Art report No changes today  2.  Permanent atrial flutter Per Dr Jackalyn Lombard office note and prior discussions with Dr Osborne Casco, given advanced age, will not anticoagulate  3.  HTN Stable No change required today    Current  medicines are reviewed at length with the patient today.   The patient does not have concerns regarding his medicines.  The following changes were made today:  none  Labs/ tests ordered today include: none No orders of the defined types were placed in this encounter.    Disposition:   Follow up with Merlin, me in 1 year    Signed, Chanetta Marshall, NP 02/20/2018 11:50 AM  [place]  [address] [place] [address] [phone] (office) [phone] (fax)

## 2018-02-23 ENCOUNTER — Encounter: Payer: Medicare HMO | Admitting: Nurse Practitioner

## 2018-02-28 DIAGNOSIS — Z961 Presence of intraocular lens: Secondary | ICD-10-CM | POA: Diagnosis not present

## 2018-02-28 DIAGNOSIS — H353221 Exudative age-related macular degeneration, left eye, with active choroidal neovascularization: Secondary | ICD-10-CM | POA: Diagnosis not present

## 2018-02-28 DIAGNOSIS — H401133 Primary open-angle glaucoma, bilateral, severe stage: Secondary | ICD-10-CM | POA: Diagnosis not present

## 2018-03-15 ENCOUNTER — Encounter: Payer: Self-pay | Admitting: Nurse Practitioner

## 2018-05-09 ENCOUNTER — Encounter (HOSPITAL_COMMUNITY): Payer: Self-pay | Admitting: Pharmacy Technician

## 2018-05-09 ENCOUNTER — Inpatient Hospital Stay (HOSPITAL_COMMUNITY)
Admission: EM | Admit: 2018-05-09 | Discharge: 2018-05-16 | DRG: 682 | Disposition: A | Discharge status: Skilled Nursing Facility | Payer: Medicare HMO | Attending: Internal Medicine | Admitting: Internal Medicine

## 2018-05-09 ENCOUNTER — Other Ambulatory Visit: Payer: Self-pay

## 2018-05-09 ENCOUNTER — Emergency Department (HOSPITAL_COMMUNITY): Payer: Medicare HMO

## 2018-05-09 DIAGNOSIS — I2510 Atherosclerotic heart disease of native coronary artery without angina pectoris: Secondary | ICD-10-CM | POA: Diagnosis present

## 2018-05-09 DIAGNOSIS — N183 Chronic kidney disease, stage 3 unspecified: Secondary | ICD-10-CM | POA: Diagnosis present

## 2018-05-09 DIAGNOSIS — C7951 Secondary malignant neoplasm of bone: Secondary | ICD-10-CM | POA: Diagnosis not present

## 2018-05-09 DIAGNOSIS — I4892 Unspecified atrial flutter: Secondary | ICD-10-CM | POA: Diagnosis not present

## 2018-05-09 DIAGNOSIS — J449 Chronic obstructive pulmonary disease, unspecified: Secondary | ICD-10-CM | POA: Diagnosis present

## 2018-05-09 DIAGNOSIS — M5137 Other intervertebral disc degeneration, lumbosacral region: Secondary | ICD-10-CM | POA: Diagnosis not present

## 2018-05-09 DIAGNOSIS — Z87891 Personal history of nicotine dependence: Secondary | ICD-10-CM | POA: Diagnosis not present

## 2018-05-09 DIAGNOSIS — Z950 Presence of cardiac pacemaker: Secondary | ICD-10-CM

## 2018-05-09 DIAGNOSIS — I129 Hypertensive chronic kidney disease with stage 1 through stage 4 chronic kidney disease, or unspecified chronic kidney disease: Secondary | ICD-10-CM | POA: Diagnosis present

## 2018-05-09 DIAGNOSIS — E1129 Type 2 diabetes mellitus with other diabetic kidney complication: Secondary | ICD-10-CM | POA: Diagnosis not present

## 2018-05-09 DIAGNOSIS — Z79899 Other long term (current) drug therapy: Secondary | ICD-10-CM | POA: Diagnosis not present

## 2018-05-09 DIAGNOSIS — Z7401 Bed confinement status: Secondary | ICD-10-CM | POA: Diagnosis not present

## 2018-05-09 DIAGNOSIS — S3992XA Unspecified injury of lower back, initial encounter: Secondary | ICD-10-CM | POA: Diagnosis not present

## 2018-05-09 DIAGNOSIS — M5136 Other intervertebral disc degeneration, lumbar region: Secondary | ICD-10-CM | POA: Diagnosis present

## 2018-05-09 DIAGNOSIS — S0990XA Unspecified injury of head, initial encounter: Secondary | ICD-10-CM | POA: Diagnosis not present

## 2018-05-09 DIAGNOSIS — M899 Disorder of bone, unspecified: Secondary | ICD-10-CM

## 2018-05-09 DIAGNOSIS — R404 Transient alteration of awareness: Secondary | ICD-10-CM | POA: Diagnosis not present

## 2018-05-09 DIAGNOSIS — M549 Dorsalgia, unspecified: Secondary | ICD-10-CM | POA: Diagnosis not present

## 2018-05-09 DIAGNOSIS — E875 Hyperkalemia: Secondary | ICD-10-CM | POA: Diagnosis not present

## 2018-05-09 DIAGNOSIS — E119 Type 2 diabetes mellitus without complications: Secondary | ICD-10-CM | POA: Diagnosis not present

## 2018-05-09 DIAGNOSIS — M109 Gout, unspecified: Secondary | ICD-10-CM | POA: Diagnosis not present

## 2018-05-09 DIAGNOSIS — S299XXA Unspecified injury of thorax, initial encounter: Secondary | ICD-10-CM | POA: Diagnosis not present

## 2018-05-09 DIAGNOSIS — S3993XA Unspecified injury of pelvis, initial encounter: Secondary | ICD-10-CM | POA: Diagnosis not present

## 2018-05-09 DIAGNOSIS — I10 Essential (primary) hypertension: Secondary | ICD-10-CM | POA: Diagnosis not present

## 2018-05-09 DIAGNOSIS — W19XXXA Unspecified fall, initial encounter: Secondary | ICD-10-CM

## 2018-05-09 DIAGNOSIS — M2550 Pain in unspecified joint: Secondary | ICD-10-CM | POA: Diagnosis not present

## 2018-05-09 DIAGNOSIS — G9341 Metabolic encephalopathy: Secondary | ICD-10-CM

## 2018-05-09 DIAGNOSIS — N400 Enlarged prostate without lower urinary tract symptoms: Secondary | ICD-10-CM | POA: Diagnosis present

## 2018-05-09 DIAGNOSIS — E872 Acidosis, unspecified: Secondary | ICD-10-CM | POA: Diagnosis present

## 2018-05-09 DIAGNOSIS — E860 Dehydration: Secondary | ICD-10-CM

## 2018-05-09 DIAGNOSIS — Z833 Family history of diabetes mellitus: Secondary | ICD-10-CM

## 2018-05-09 DIAGNOSIS — N179 Acute kidney failure, unspecified: Principal | ICD-10-CM

## 2018-05-09 DIAGNOSIS — E785 Hyperlipidemia, unspecified: Secondary | ICD-10-CM | POA: Diagnosis not present

## 2018-05-09 DIAGNOSIS — E1122 Type 2 diabetes mellitus with diabetic chronic kidney disease: Secondary | ICD-10-CM | POA: Diagnosis present

## 2018-05-09 DIAGNOSIS — S199XXA Unspecified injury of neck, initial encounter: Secondary | ICD-10-CM | POA: Diagnosis not present

## 2018-05-09 DIAGNOSIS — C61 Malignant neoplasm of prostate: Secondary | ICD-10-CM

## 2018-05-09 DIAGNOSIS — E7800 Pure hypercholesterolemia, unspecified: Secondary | ICD-10-CM | POA: Diagnosis not present

## 2018-05-09 DIAGNOSIS — I959 Hypotension, unspecified: Secondary | ICD-10-CM | POA: Diagnosis not present

## 2018-05-09 DIAGNOSIS — I483 Typical atrial flutter: Secondary | ICD-10-CM | POA: Diagnosis not present

## 2018-05-09 DIAGNOSIS — Z8546 Personal history of malignant neoplasm of prostate: Secondary | ICD-10-CM

## 2018-05-09 DIAGNOSIS — Z9181 History of falling: Secondary | ICD-10-CM | POA: Diagnosis not present

## 2018-05-09 DIAGNOSIS — E1165 Type 2 diabetes mellitus with hyperglycemia: Secondary | ICD-10-CM | POA: Diagnosis not present

## 2018-05-09 DIAGNOSIS — R410 Disorientation, unspecified: Secondary | ICD-10-CM | POA: Diagnosis not present

## 2018-05-09 DIAGNOSIS — R4182 Altered mental status, unspecified: Secondary | ICD-10-CM | POA: Diagnosis not present

## 2018-05-09 LAB — COMPREHENSIVE METABOLIC PANEL
ALT: 13 U/L (ref 0–44)
ANION GAP: 8 (ref 5–15)
AST: 26 U/L (ref 15–41)
Albumin: 3.6 g/dL (ref 3.5–5.0)
Alkaline Phosphatase: 79 U/L (ref 38–126)
BILIRUBIN TOTAL: 0.4 mg/dL (ref 0.3–1.2)
BUN: 42 mg/dL — AB (ref 8–23)
CO2: 23 mmol/L (ref 22–32)
Calcium: 9.5 mg/dL (ref 8.9–10.3)
Chloride: 103 mmol/L (ref 98–111)
Creatinine, Ser: 2.23 mg/dL — ABNORMAL HIGH (ref 0.61–1.24)
GFR calc Af Amer: 27 mL/min — ABNORMAL LOW (ref >60)
GFR calc non Af Amer: 23 mL/min — ABNORMAL LOW (ref >60)
Glucose, Bld: 205 mg/dL — ABNORMAL HIGH (ref 70–99)
POTASSIUM: 6 mmol/L — AB (ref 3.5–5.1)
SODIUM: 134 mmol/L — AB (ref 135–145)
Total Protein: 7.2 g/dL (ref 6.5–8.1)

## 2018-05-09 LAB — PROCALCITONIN: Procalcitonin: 0.22 ng/mL

## 2018-05-09 LAB — CBC
HEMATOCRIT: 36.9 % — AB (ref 39.0–52.0)
HEMOGLOBIN: 12 g/dL — AB (ref 13.0–17.0)
MCH: 28.7 pg (ref 26.0–34.0)
MCHC: 32.5 g/dL (ref 30.0–36.0)
MCV: 88.3 fL (ref 80.0–100.0)
NRBC: 0 % (ref 0.0–0.2)
Platelets: 146 10*3/uL — ABNORMAL LOW (ref 150–400)
RBC: 4.18 MIL/uL — ABNORMAL LOW (ref 4.22–5.81)
RDW: 12.4 % (ref 11.5–15.5)
WBC: 7.8 10*3/uL (ref 4.0–10.5)

## 2018-05-09 LAB — LIPASE, BLOOD: Lipase: 29 U/L (ref 11–51)

## 2018-05-09 LAB — CK: Total CK: 89 U/L (ref 49–397)

## 2018-05-09 LAB — I-STAT CG4 LACTIC ACID, ED: Lactic Acid, Venous: 3.36 mmol/L (ref 0.5–1.9)

## 2018-05-09 LAB — PROTIME-INR
INR: 1.05
Prothrombin Time: 13.6 seconds (ref 11.4–15.2)

## 2018-05-09 LAB — URINALYSIS, ROUTINE W REFLEX MICROSCOPIC
BILIRUBIN URINE: NEGATIVE
Bacteria, UA: NONE SEEN
GLUCOSE, UA: NEGATIVE mg/dL
HGB URINE DIPSTICK: NEGATIVE
KETONES UR: NEGATIVE mg/dL
Leukocytes, UA: NEGATIVE
NITRITE: NEGATIVE
PH: 6 (ref 5.0–8.0)
PROTEIN: 30 mg/dL — AB
Specific Gravity, Urine: 1.017 (ref 1.005–1.030)

## 2018-05-09 LAB — APTT: APTT: 24 s (ref 24–36)

## 2018-05-09 LAB — PSA: Prostatic Specific Antigen: 45.13 ng/mL — ABNORMAL HIGH (ref 0.00–4.00)

## 2018-05-09 LAB — LACTIC ACID, PLASMA: LACTIC ACID, VENOUS: 4.1 mmol/L — AB (ref 0.5–1.9)

## 2018-05-09 LAB — I-STAT TROPONIN, ED: Troponin i, poc: 0.02 ng/mL (ref 0.00–0.08)

## 2018-05-09 MED ORDER — SODIUM POLYSTYRENE SULFONATE 15 GM/60ML PO SUSP
45.0000 g | Freq: Once | ORAL | Status: AC
  Administered 2018-05-09: 45 g via ORAL
  Filled 2018-05-09: qty 180

## 2018-05-09 MED ORDER — OXYCODONE-ACETAMINOPHEN 5-325 MG PO TABS
1.0000 | ORAL_TABLET | ORAL | Status: DC | PRN
  Administered 2018-05-10 – 2018-05-15 (×5): 1 via ORAL
  Filled 2018-05-09 (×5): qty 1

## 2018-05-09 MED ORDER — SODIUM CHLORIDE 0.9 % IV SOLN
INTRAVENOUS | Status: DC
  Administered 2018-05-10 (×2): via INTRAVENOUS

## 2018-05-09 MED ORDER — TIMOLOL MALEATE 0.5 % OP SOLG: 1.0000 [drp] | Freq: Every day | OPHTHALMIC | Status: DC

## 2018-05-09 MED ORDER — SENNOSIDES-DOCUSATE SODIUM 8.6-50 MG PO TABS: 1.0000 | ORAL_TABLET | Freq: Every evening | ORAL | Status: DC | PRN

## 2018-05-09 MED ORDER — INSULIN ASPART 100 UNIT/ML ~~LOC~~ SOLN: 0.0000 [IU] | Freq: Every day | SUBCUTANEOUS | Status: DC

## 2018-05-09 MED ORDER — CALCIUM GLUCONATE-NACL 1-0.675 GM/50ML-% IV SOLN
1.0000 g | Freq: Once | INTRAVENOUS | Status: AC
  Administered 2018-05-09: 1000 mg via INTRAVENOUS
  Filled 2018-05-09: qty 50

## 2018-05-09 MED ORDER — BRIMONIDINE TARTRATE-TIMOLOL 0.2-0.5 % OP SOLN: 1.0000 [drp] | Freq: Two times a day (BID) | OPHTHALMIC | Status: DC

## 2018-05-09 MED ORDER — ZOLPIDEM TARTRATE 5 MG PO TABS
5.0000 mg | ORAL_TABLET | Freq: Every evening | ORAL | Status: DC | PRN
  Administered 2018-05-13: 5 mg via ORAL
  Filled 2018-05-09 (×2): qty 1

## 2018-05-09 MED ORDER — TIMOLOL MALEATE 0.5 % OP SOLN
1.0000 [drp] | Freq: Two times a day (BID) | OPHTHALMIC | Status: DC
  Administered 2018-05-10 – 2018-05-16 (×13): 1 [drp] via OPHTHALMIC
  Filled 2018-05-09: qty 5

## 2018-05-09 MED ORDER — HYDRALAZINE HCL 20 MG/ML IJ SOLN: 5.0000 mg | INTRAMUSCULAR | Status: DC | PRN

## 2018-05-09 MED ORDER — DEXTROSE 50 % IV SOLN
25.0000 g | Freq: Once | INTRAVENOUS | Status: AC
  Administered 2018-05-09: 25 g via INTRAVENOUS
  Filled 2018-05-09: qty 50

## 2018-05-09 MED ORDER — BRIMONIDINE TARTRATE 0.2 % OP SOLN
1.0000 [drp] | Freq: Two times a day (BID) | OPHTHALMIC | Status: DC
  Administered 2018-05-10 – 2018-05-16 (×13): 1 [drp] via OPHTHALMIC
  Filled 2018-05-09: qty 5

## 2018-05-09 MED ORDER — AMLODIPINE BESYLATE 5 MG PO TABS
5.0000 mg | ORAL_TABLET | Freq: Every day | ORAL | Status: DC
  Administered 2018-05-10 – 2018-05-16 (×7): 5 mg via ORAL
  Filled 2018-05-09 (×7): qty 1

## 2018-05-09 MED ORDER — HEPARIN SODIUM (PORCINE) 5000 UNIT/ML IJ SOLN
5000.0000 [IU] | Freq: Three times a day (TID) | INTRAMUSCULAR | Status: DC
  Administered 2018-05-10 – 2018-05-16 (×19): 5000 [IU] via SUBCUTANEOUS
  Filled 2018-05-09 (×19): qty 1

## 2018-05-09 MED ORDER — INSULIN ASPART 100 UNIT/ML ~~LOC~~ SOLN
0.0000 [IU] | Freq: Three times a day (TID) | SUBCUTANEOUS | Status: DC
  Administered 2018-05-10 – 2018-05-11 (×2): 1 [IU] via SUBCUTANEOUS
  Administered 2018-05-14 – 2018-05-15 (×2): 2 [IU] via SUBCUTANEOUS
  Administered 2018-05-16: 1 [IU] via SUBCUTANEOUS

## 2018-05-09 MED ORDER — ACETAMINOPHEN 325 MG PO TABS
650.0000 mg | ORAL_TABLET | Freq: Four times a day (QID) | ORAL | Status: DC | PRN
  Administered 2018-05-13: 650 mg via ORAL
  Filled 2018-05-09: qty 2

## 2018-05-09 MED ORDER — INSULIN ASPART 100 UNIT/ML IV SOLN
5.0000 [IU] | Freq: Once | INTRAVENOUS | Status: AC
  Administered 2018-05-09: 5 [IU] via INTRAVENOUS

## 2018-05-09 MED ORDER — CARVEDILOL 12.5 MG PO TABS
12.5000 mg | ORAL_TABLET | Freq: Two times a day (BID) | ORAL | Status: DC
  Administered 2018-05-10 – 2018-05-16 (×12): 12.5 mg via ORAL
  Filled 2018-05-09 (×13): qty 1

## 2018-05-09 MED ORDER — FINASTERIDE 5 MG PO TABS
5.0000 mg | ORAL_TABLET | Freq: Every day | ORAL | Status: DC
  Administered 2018-05-10 – 2018-05-16 (×7): 5 mg via ORAL
  Filled 2018-05-09 (×7): qty 1

## 2018-05-09 MED ORDER — SODIUM CHLORIDE 0.9 % IV BOLUS
500.0000 mL | Freq: Once | INTRAVENOUS | Status: AC
  Administered 2018-05-09: 500 mL via INTRAVENOUS

## 2018-05-09 MED ORDER — ACETAMINOPHEN 650 MG RE SUPP: 650.0000 mg | Freq: Four times a day (QID) | RECTAL | Status: DC | PRN

## 2018-05-09 MED ORDER — ONDANSETRON HCL 4 MG/2ML IJ SOLN: 4.0000 mg | Freq: Four times a day (QID) | INTRAMUSCULAR | Status: DC | PRN

## 2018-05-09 MED ORDER — ONDANSETRON HCL 4 MG PO TABS: 4.0000 mg | ORAL_TABLET | Freq: Four times a day (QID) | ORAL | Status: DC | PRN

## 2018-05-09 MED ORDER — ATORVASTATIN CALCIUM 10 MG PO TABS
20.0000 mg | ORAL_TABLET | Freq: Every day | ORAL | Status: DC
  Administered 2018-05-10 – 2018-05-16 (×7): 20 mg via ORAL
  Filled 2018-05-09 (×7): qty 2

## 2018-05-09 MED ORDER — POLYETHYLENE GLYCOL 3350 17 G PO PACK
17.0000 g | PACK | Freq: Every day | ORAL | Status: DC
  Administered 2018-05-10 – 2018-05-16 (×6): 17 g via ORAL
  Filled 2018-05-09 (×7): qty 1

## 2018-05-09 MED ORDER — SODIUM CHLORIDE 0.9 % IV BOLUS
1000.0000 mL | Freq: Once | INTRAVENOUS | Status: AC
  Administered 2018-05-09: 1000 mL via INTRAVENOUS

## 2018-05-09 NOTE — ED Provider Notes (Signed)
[facility] Emergency Department Provider Note MRN:  [id number]  Arrival date & time: 05/09/18     Chief Complaint   Altered Mental Status   History of Present Illness   Mario Warner. is a 84 y.o. year-old male with a history of diabetes, CAD, pacemaker presenting to the ED with chief complaint of altered mental status.  Reportedly fell today at unknown time.  Per report is usually alert and oriented x4.  Patient lives alone.  Only oriented to his name currently.  Complaining of severe lower back pain.  I was unable to obtain an accurate HPI, PMH, or ROS due to the patient's altered mental status.  Review of Systems  Positive for altered mental status, back pain.  Patient's Health History    Past Medical History:  Diagnosis Date  . Atrial flutter ([place])    chronically anticoagulated with coumadin  . Benign prostatic hypertrophy   . BENIGN PROSTATIC HYPERTROPHY, HX OF 04/01/2010   Qualifier: Diagnosis of  By: Selena Batten CMA, Jewel    . Bradycardia    s/p PPM by Dr Rayann Heman  . Bright red blood per rectum 12/26/2011  . CAD (coronary artery disease)   . Complete heart block ([place]) 02/05/2011  . DEGENERATIVE JOINT DISEASE 04/01/2010   Qualifier: Diagnosis of  By: Selena Batten CMA, Jewel    . [place] DISEASE, LUMBAR 04/01/2010   Qualifier: Diagnosis of  By: Selena Batten CMA, Jewel    . DJD (degenerative joint disease)   . DJD (degenerative joint disease)   . DM (diabetes mellitus) ([place])   . DM (diabetes mellitus) ([place])   . Glaucoma   . Gout   . HTN (hypertension)   . Hypercholesterolemia   . Hyperlipidemia   . Impotence   . Lumbar back pain   . Lumbar disc disease   . OA (osteoarthritis) of knee   . Pacemaker   . PC (prostate cancer) ([place])   . Peptic ulcer   . Subdural hematoma ([place]) 12/24/2011  . Syncope 12/24/2011    Past Surgical History:  Procedure Laterality Date  . CATARACT EXTRACTION  12/2010  . HEMORRHOID SURGERY    . INSERT / REPLACE / REMOVE PACEMAKER    .  PACEMAKER INSERTION  5/12   by Dr Rayann Heman - St. Jude device  . TRANSURETHRAL RESECTION OF PROSTATE      Family History  Problem Relation Age of Onset  . Diabetes Mother   . Blindness Mother   . Diabetes Father     Social History   Socioeconomic History  . Marital status: Widowed    Spouse name: Not on file  . Number of children: 1  . Years of education: Not on file  . Highest education level: Not on file  Occupational History  . Not on file  Social Needs  . Financial resource strain: Not on file  . Food insecurity:    Worry: Not on file    Inability: Not on file  . Transportation needs:    Medical: Not on file    Non-medical: Not on file  Tobacco Use  . Smoking status: Former Smoker    Last attempt to quit: 02/05/1971    Years since quitting: 47.2  . Smokeless tobacco: Never Used  Substance and Sexual Activity  . Alcohol use: No  . Drug use: No  . Sexual activity: Never  Lifestyle  . Physical activity:    Days per week: Not on file    Minutes per session: Not on  file  . Stress: Not on file  Relationships  . Social connections:    Talks on phone: Not on file    Gets together: Not on file    Attends religious service: Not on file    Active member of club or organization: Not on file    Attends meetings of clubs or organizations: Not on file    Relationship status: Not on file  . Intimate partner violence:    Fear of current or ex partner: Not on file    Emotionally abused: Not on file    Physically abused: Not on file    Forced sexual activity: Not on file  Other Topics Concern  . Not on file  Social History Narrative   Lives alone.       Physical Exam  Vital Signs and Nursing Notes reviewed Vitals:   05/09/18 1915 05/09/18 1930  BP: (!) 142/87 (!) 156/76  Resp: 15 20  Temp:    SpO2:      CONSTITUTIONAL: Chronically ill-appearing, NAD NEURO:  Alert and oriented x 3, no focal deficits EYES:  eyes equal and reactive ENT/NECK:  no LAD, no JVD CARDIO:  Regular rate, well-perfused, normal S1 and S2 PULM:  CTAB no wheezing or rhonchi GI/GU:  normal bowel sounds, non-distended, non-tender MSK/SPINE:  No gross deformities, no edema; tenderness palpation to the midline lumbar spine SKIN:  no rash, atraumatic PSYCH:  Appropriate speech and behavior  Diagnostic and Interventional Summary    EKG Interpretation  Date/Time:  Tuesday May 09 2018 15:53:40 EST Ventricular Rate:  60 PR Interval:    QRS Duration: 151 QT Interval:  469 QTC Calculation: 469 R Axis:   -86 Text Interpretation:  Ventricular-paced rhythm No further analysis attempted due to paced rhythm Baseline wander in lead(s) V4 Confirmed by Gerlene Fee [phone]) on 05/09/2018 4:18:26 PM      Labs Reviewed  CBC - Abnormal; Notable for the following components:      Result Value   RBC 4.18 (*)    Hemoglobin 12.0 (*)    HCT 36.9 (*)    Platelets 146 (*)    All other components within normal limits  COMPREHENSIVE METABOLIC PANEL - Abnormal; Notable for the following components:   Sodium 134 (*)    Potassium 6.0 (*)    Glucose, Bld 205 (*)    BUN 42 (*)    Creatinine, Ser 2.23 (*)    GFR calc non Af Amer 23 (*)    GFR calc Af Amer 27 (*)    All other components within normal limits  URINALYSIS, ROUTINE W REFLEX MICROSCOPIC - Abnormal; Notable for the following components:   APPearance HAZY (*)    Protein, ur 30 (*)    All other components within normal limits  PSA - Abnormal; Notable for the following components:   Prostatic Specific Antigen 45.13 (*)    All other components within normal limits  I-STAT CG4 LACTIC ACID, ED - Abnormal; Notable for the following components:   Lactic Acid, Venous 3.36 (*)    All other components within normal limits  URINE CULTURE  CULTURE, BLOOD (ROUTINE X 2)  CULTURE, BLOOD (ROUTINE X 2)  LIPASE, BLOOD  CK  APTT  PROTIME-INR  BASIC METABOLIC PANEL  CBC  LACTIC ACID, PLASMA  LACTIC ACID, PLASMA  PROCALCITONIN  I-STAT  TROPONIN, ED    CT Lumbar Spine Wo Contrast  Final Result    CT HEAD WO CONTRAST  Final Result    CT  Cervical Spine Wo Contrast  Final Result    DG Chest Port 1 View  Final Result    DG Pelvis Portable  Final Result      Medications  amLODipine (NORVASC) tablet 5 mg (has no administration in time range)  atorvastatin (LIPITOR) tablet 20 mg (has no administration in time range)  carvedilol (COREG) tablet 12.5 mg (has no administration in time range)  polyethylene glycol (MIRALAX / GLYCOLAX) packet 17 g (has no administration in time range)  finasteride (PROSCAR) tablet 5 mg (has no administration in time range)  brimonidine-timolol (COMBIGAN) 0.2-0.5 % ophthalmic solution 1 drop (has no administration in time range)  timolol (TIMOPTIC-XR) 0.5 % ophthalmic gel-forming 1 drop (has no administration in time range)  sodium chloride 0.9 % bolus 500 mL (has no administration in time range)  0.9 %  sodium chloride infusion (has no administration in time range)  insulin aspart (novoLOG) injection 0-9 Units (has no administration in time range)  insulin aspart (novoLOG) injection 0-5 Units (has no administration in time range)  heparin injection 5,000 Units (has no administration in time range)  acetaminophen (TYLENOL) tablet 650 mg (has no administration in time range)    Or  acetaminophen (TYLENOL) suppository 650 mg (has no administration in time range)  senna-docusate (Senokot-S) tablet 1 tablet (has no administration in time range)  ondansetron (ZOFRAN) tablet 4 mg (has no administration in time range)    Or  ondansetron (ZOFRAN) injection 4 mg (has no administration in time range)  hydrALAZINE (APRESOLINE) injection 5 mg (has no administration in time range)  zolpidem (AMBIEN) tablet 5 mg (has no administration in time range)  oxyCODONE-acetaminophen (PERCOCET/ROXICET) 5-325 MG per tablet 1 tablet (has no administration in time range)  sodium chloride 0.9 % bolus 500 mL (0 mLs  Intravenous Stopped 05/09/18 1937)  sodium chloride 0.9 % bolus 1,000 mL (0 mLs Intravenous Stopped 05/09/18 2057)  dextrose 50 % solution 25 g (25 g Intravenous Given 05/09/18 1943)  insulin aspart (novoLOG) injection 5 Units (5 Units Intravenous Given 05/09/18 1943)  sodium polystyrene (KAYEXALATE) 15 GM/60ML suspension 45 g (45 g Oral Given 05/09/18 1943)  calcium gluconate 1 g/ 50 mL sodium chloride IVPB (1,000 mg Intravenous New Bag/Given 05/09/18 2012)     Procedures EMERGENCY DEPARTMENT ULTRASOUND  Study: Limited Retroperitoneal Ultrasound of the Abdominal Aorta.  INDICATIONS:Back pain Multiple views of the abdominal aorta were obtained in real-time from the diaphragmatic hiatus to the aortic bifurcation in transverse planes with a multi-frequency probe.  PERFORMED BY: Myself IMAGES ARCHIVED?: Yes LIMITATIONS:  None INTERPRETATION:  No abdominal aortic aneurysm   Critical Care  ED Course and Medical Decision Making  I have reviewed the triage vital signs and the nursing notes.  Pertinent labs & imaging results that were available during my care of the patient were reviewed by me and considered in my medical decision making (see below for details).  Considering traumatic fractures related to fall, subdural hematoma, but also considering secondary causes of fall such as metabolic disarray, UTI, ACS.  Bedside ultrasound reveals normal caliber aorta, lessening the concern for AAA.  Work-up pending.  Labs reveal AKI, imaging reveals concern for lytic lesions of the lumbar spine consistent with metastatic prostate cancer.  Admitted to hospital service for further care and evaluation.  Barth Kirks. Sedonia Small, MD [place] [email or web address]  Final Clinical Impressions(s) / ED Diagnoses     ICD-10-CM   1. Lytic bone lesions on xray M89.9  2. Fall W19.Merril Abbe DG Chest Port 1 View    DG Chest Port 1 View  3. AKI (acute kidney injury)  [facility]) N17.9     ED Discharge Orders    None         Maudie Flakes, MD 05/09/18 2211

## 2018-05-09 NOTE — ED Notes (Signed)
ED TO INPATIENT HANDOFF REPORT  Name/Age/Gender Mario Warner. 84 y.o. male  Code Status    Code Status Orders  (From admission, onward)         Start     Ordered   05/09/18 1944  Full code  Continuous     05/09/18 1944        Code Status History    This patient has a current code status but no historical code status.      Home/SNF/Other Home  Chief Complaint ams/fall  Level of Care/Admitting Diagnosis ED Disposition    ED Disposition Condition Comment   Admit  Hospital Area: [facility] [[id number]]  Level of Care: Telemetry [5]  I expect the patient will be discharged within 24 hours: No (not a candidate for 5C-Observation unit)  Diagnosis: Acute metabolic encephalopathy [[id number]]  Admitting Physician: Ivor Costa [[id number]]  Attending Physician: Ivor Costa [[id number]]  PT Class (Do Not Modify): Observation [104]  PT Acc Code (Do Not Modify): Observation [10022]       Medical History Past Medical History:  Diagnosis Date  . Atrial flutter ([place])    chronically anticoagulated with coumadin  . Benign prostatic hypertrophy   . BENIGN PROSTATIC HYPERTROPHY, HX OF 04/01/2010   Qualifier: Diagnosis of  By: Selena Batten CMA, Jewel    . Bradycardia    s/p PPM by Dr Rayann Heman  . Bright red blood per rectum 12/26/2011  . CAD (coronary artery disease)   . Complete heart block ([place]) 02/05/2011  . DEGENERATIVE JOINT DISEASE 04/01/2010   Qualifier: Diagnosis of  By: Selena Batten CMA, Jewel    . [place]er DISEASE, LUMBAR 04/01/2010   Qualifier: Diagnosis of  By: Selena Batten CMA, Jewel    . DJD (degenerative joint disease)   . DJD (degenerative joint disease)   . DM (diabetes mellitus) ([place])   . DM (diabetes mellitus) ([place])   . Glaucoma   . Gout   . HTN (hypertension)   . Hypercholesterolemia   . Hyperlipidemia   . Impotence   . Lumbar back pain   . Lumbar disc disease   . OA (osteoarthritis) of knee   . Pacemaker   . PC (prostate cancer) ([place])   . Peptic ulcer   . Subdural  hematoma ([place]) 12/24/2011  . Syncope 12/24/2011    Allergies No Known Allergies  IV Location/Drains/Wounds Patient Lines/Drains/Airways Status   Active Line/Drains/Airways    Name:   Placement date:   Placement time:   Site:   Days:   Peripheral IV 05/09/18 Anterior;Right Forearm   05/09/18    2127    Forearm   less than 1          Labs/Imaging Results for orders placed or performed during the hospital encounter of 05/09/18 (from the past 48 hour(s))  CBC     Status: Abnormal   Collection Time: 05/09/18  4:05 PM  Result Value Ref Range   WBC 7.8 4.0 - 10.5 K/uL   RBC 4.18 (L) 4.22 - 5.81 MIL/uL   Hemoglobin 12.0 (L) 13.0 - 17.0 g/dL   HCT 36.9 (L) 39.0 - 52.0 %   MCV 88.3 80.0 - 100.0 fL   MCH 28.7 26.0 - 34.0 pg   MCHC 32.5 30.0 - 36.0 g/dL   RDW 12.4 11.5 - 15.5 %   Platelets 146 (L) 150 - 400 K/uL   nRBC 0.0 0.0 - 0.2 %    Comment: Performed at [facility], [address],  [place] [address]  Comprehensive metabolic panel     Status: Abnormal   Collection Time: 05/09/18  4:05 PM  Result Value Ref Range   Sodium 134 (L) 135 - 145 mmol/L   Potassium 6.0 (H) 3.5 - 5.1 mmol/L   Chloride 103 98 - 111 mmol/L   CO2 23 22 - 32 mmol/L   Glucose, Bld 205 (H) 70 - 99 mg/dL   BUN 42 (H) 8 - 23 mg/dL   Creatinine, Ser 2.23 (H) 0.61 - 1.24 mg/dL   Calcium 9.5 8.9 - 10.3 mg/dL   Total Protein 7.2 6.5 - 8.1 g/dL   Albumin 3.6 3.5 - 5.0 g/dL   AST 26 15 - 41 U/L   ALT 13 0 - 44 U/L   Alkaline Phosphatase 79 38 - 126 U/L   Total Bilirubin 0.4 0.3 - 1.2 mg/dL   GFR calc non Af Amer 23 (L) >60 mL/min   GFR calc Af Amer 27 (L) >60 mL/min   Anion gap 8 5 - 15    Comment: Performed at [facility], [place] [address], [place], [place] [address]  Lipase, blood     Status: None   Collection Time: 05/09/18  4:05 PM  Result Value Ref Range   Lipase 29 11 - 51 U/L    Comment: Performed at [facility] [address] [place], [place] [address]  CK     Status:  None   Collection Time: 05/09/18  4:05 PM  Result Value Ref Range   Total CK 89 49 - 397 U/L    Comment: Performed at [facility], [place] [address], [place], [place] [address]  APTT     Status: None   Collection Time: 05/09/18  4:05 PM  Result Value Ref Range   aPTT 24 24 - 36 seconds    Comment: Performed at [facility] [address], [place], [place] [address]  Protime-INR     Status: None   Collection Time: 05/09/18  4:05 PM  Result Value Ref Range   Prothrombin Time 13.6 11.4 - 15.2 seconds   INR 1.05     Comment: Performed at [facility] [address] [place], [place] [address]  I-Stat CG4 Lactic Acid, ED     Status: Abnormal   Collection Time: 05/09/18  5:08 PM  Result Value Ref Range   Lactic Acid, Venous 3.36 (HH) 0.5 - 1.9 mmol/L   Comment NOTIFIED PHYSICIAN   I-stat troponin, ED     Status: None   Collection Time: 05/09/18  5:22 PM  Result Value Ref Range   Troponin i, poc 0.02 0.00 - 0.08 ng/mL   Comment 3            Comment: Due to the release kinetics of cTnI, a negative result within the first hours of the onset of symptoms does not rule out myocardial infarction with certainty. If myocardial infarction is still suspected, repeat the test at appropriate intervals.   Urinalysis, Routine w reflex microscopic     Status: Abnormal   Collection Time: 05/09/18  7:38 PM  Result Value Ref Range   Color, Urine YELLOW YELLOW   APPearance HAZY (A) CLEAR   Specific Gravity, Urine 1.017 1.005 - 1.030   pH 6.0 5.0 - 8.0   Glucose, UA NEGATIVE NEGATIVE mg/dL   Hgb urine dipstick NEGATIVE NEGATIVE   Bilirubin Urine NEGATIVE NEGATIVE   Ketones, ur NEGATIVE NEGATIVE mg/dL   Protein,  ur 30 (A) NEGATIVE mg/dL   Nitrite NEGATIVE NEGATIVE   Leukocytes, UA NEGATIVE NEGATIVE   RBC / HPF 0-5 0 - 5 RBC/hpf   WBC, UA 0-5 0 - 5 WBC/hpf   Bacteria, UA NONE SEEN NONE SEEN   Squamous Epithelial / LPF 0-5 0 - 5   Mucus PRESENT    Hyaline Casts, UA PRESENT      Comment: Performed at [facility], [place] [address] [place], [place] [address]  PSA     Status: Abnormal   Collection Time: 05/09/18  8:50 PM  Result Value Ref Range   Prostatic Specific Antigen 45.13 (H) 0.00 - 4.00 ng/mL    Comment: (NOTE) While PSA levels of <=4.0 ng/ml are reported as reference range, some men with levels below 4.0 ng/ml can have prostate cancer and many men with PSA above 4.0 ng/ml do not have prostate cancer.  Other tests such as free PSA, age specific reference ranges, PSA velocity and PSA doubling time may be helpful especially in men less than 59 years old. Performed at [facility], [place] [address], [place], [place] [address]    Ct Head Wo Contrast  Result Date: 05/09/2018 CLINICAL DATA:  84 year old male post fall.  Initial encounter. EXAM: CT HEAD WITHOUT CONTRAST CT CERVICAL SPINE WITHOUT CONTRAST TECHNIQUE: Multidetector CT imaging of the head and cervical spine was performed following the standard protocol without intravenous contrast. Multiplanar CT image reconstructions of the cervical spine were also generated. COMPARISON:  12/26/2011 head CT.  No comparison cervical spine CT. FINDINGS: CT HEAD FINDINGS Brain: No intracranial hemorrhage or CT evidence of large acute infarct. Chronic microvascular changes. Global atrophy. No intracranial mass lesion noted on this unenhanced exam. Vascular: Vascular calcifications Skull: No skull fracture Sinuses/Orbits: Post lens replacement. No acute orbital abnormality. Minimal mucosal thickening left maxillary sinus. Other: Mastoid air cells and middle ear cavities are clear. CT CERVICAL SPINE FINDINGS Alignment: Mild curvature cervical spine. Slight retrolisthesis C6 and anterolisthesis C7 most likely secondary to facet degenerative changes. Skull base and vertebrae: No cervical spine fracture. Soft tissues: No abnormal prevertebral soft tissue swelling. Disc levels: Multilevel cervical spondylotic changes with  various degrees of spinal stenosis and foraminal narrowing. Spinal stenosis most notable C3-4, C5-6 and C6-7. Upper chest: No worrisome abnormality. Other: No worrisome abnormality. IMPRESSION: Head CT: 1. No skull fracture or intracranial hemorrhage. 2. Chronic microvascular changes and atrophy. Cervical spine CT: 1. Mild curvature cervical spine. Slight retrolisthesis C6 and anterolisthesis C7 most likely secondary to facet degenerative changes. 2. No cervical spine fracture or abnormal prevertebral soft tissue swelling noted. 3. Multilevel cervical spondylotic changes with various degrees of spinal stenosis and foraminal narrowing. Spinal stenosis most notable C3-4, C5-6 and C6-7. Electronically Signed   By: Genia Del M.D.   On: 05/09/2018 18:24   Ct Cervical Spine Wo Contrast  Result Date: 05/09/2018 CLINICAL DATA:  84 year old male post fall.  Initial encounter. EXAM: CT HEAD WITHOUT CONTRAST CT CERVICAL SPINE WITHOUT CONTRAST TECHNIQUE: Multidetector CT imaging of the head and cervical spine was performed following the standard protocol without intravenous contrast. Multiplanar CT image reconstructions of the cervical spine were also generated. COMPARISON:  12/26/2011 head CT.  No comparison cervical spine CT. FINDINGS: CT HEAD FINDINGS Brain: No intracranial hemorrhage or CT evidence of large acute infarct. Chronic microvascular changes. Global atrophy. No intracranial mass lesion noted on this unenhanced exam. Vascular: Vascular calcifications Skull: No skull fracture Sinuses/Orbits: Post lens replacement. No acute orbital abnormality.  Minimal mucosal thickening left maxillary sinus. Other: Mastoid air cells and middle ear cavities are clear. CT CERVICAL SPINE FINDINGS Alignment: Mild curvature cervical spine. Slight retrolisthesis C6 and anterolisthesis C7 most likely secondary to facet degenerative changes. Skull base and vertebrae: No cervical spine fracture. Soft tissues: No abnormal  prevertebral soft tissue swelling. Disc levels: Multilevel cervical spondylotic changes with various degrees of spinal stenosis and foraminal narrowing. Spinal stenosis most notable C3-4, C5-6 and C6-7. Upper chest: No worrisome abnormality. Other: No worrisome abnormality. IMPRESSION: Head CT: 1. No skull fracture or intracranial hemorrhage. 2. Chronic microvascular changes and atrophy. Cervical spine CT: 1. Mild curvature cervical spine. Slight retrolisthesis C6 and anterolisthesis C7 most likely secondary to facet degenerative changes. 2. No cervical spine fracture or abnormal prevertebral soft tissue swelling noted. 3. Multilevel cervical spondylotic changes with various degrees of spinal stenosis and foraminal narrowing. Spinal stenosis most notable C3-4, C5-6 and C6-7. Electronically Signed   By: Genia Del M.D.   On: 05/09/2018 18:24   Ct Lumbar Spine Wo Contrast  Result Date: 05/09/2018 CLINICAL DATA:  Fall today.  Back pain.  History of prostate cancer. EXAM: CT LUMBAR SPINE WITHOUT CONTRAST TECHNIQUE: Multidetector CT imaging of the lumbar spine was performed without intravenous contrast administration. Multiplanar CT image reconstructions were also generated. COMPARISON:  CT abdomen pelvis 01/27/2018 FINDINGS: Segmentation: Normal segmentation. Small ribs at T12.  S1 is partially sacralized. Alignment: 8 mm anterolisthesis L3-4.  3 mm retrolisthesis L4-5. Vertebrae: 2.3 cm sclerotic lesion L1 vertebral body on the left is increased in size since the prior CT abdomen pelvis and is consistent with metastatic disease. Sclerotic lesion right medial iliac bone incompletely evaluated but was present previously. Negative for lumbar fracture. Paraspinal and other soft tissues: Atherosclerotic aorta and iliacs without aneurysm. No retroperitoneal adenopathy. Disc levels: T11-12: Schmorl's node superior endplate of Y18. Mild disc and facet degeneration without significant stenosis T12-L1: Broad-based disc  protrusion. Bilateral facet degeneration. Moderate subarticular and foraminal stenosis bilaterally. Spinal canal adequate in size. L1-2: Moderate disc bulging. Moderate facet hypertrophy. Mild spinal stenosis. Mild subarticular stenosis bilaterally L2-3: Diffuse bulging of the disc. Moderate facet hypertrophy. Moderate spinal stenosis. Moderate subarticular and foraminal stenosis bilaterally L3-4: 8 mm anterolisthesis. Diffuse disc bulging. Severe facet hypertrophy. Probable laminectomy at this level in the past. Severe spinal stenosis. Severe subarticular and foraminal stenosis bilaterally with impingement of the L3 and L4 nerve roots bilaterally. L4-5: 3 mm retrolisthesis. Diffuse bulging of the disc. Moderate facet hypertrophy. Moderate spinal stenosis. Moderate subarticular stenosis bilaterally L5-S1: L5 transverse processes articulate with the sacrum. No significant degenerative change. IMPRESSION: 1. Sclerotic lesion L1 vertebral body with progression since CT of 01/27/2018 compatible with progressive prostate cancer metastasis. Sclerotic lesion right medial iliac bone also consistent with metastatic disease although incompletely evaluated on the study. 2. Extensive multilevel lumbar degenerative change as above. Multilevel spinal and subarticular stenosis, most severe at L2-3, L3-4, L4-5. Electronically Signed   By: Franchot Gallo M.D.   On: 05/09/2018 18:28   Dg Pelvis Portable  Result Date: 05/09/2018 CLINICAL DATA:  84 year old male with a history of fall EXAM: PORTABLE PELVIS 1-2 VIEWS COMPARISON:  CT 01/27/2018 FINDINGS: Osteopenia. Bony pelvic ring intact with no acute fracture line identified. Bilateral hips projects normally over the acetabula. Unremarkable proximal femurs. Pelvic phleboliths. Degenerative changes of the spine. Sclerotic focus of the right iliac bone again demonstrated, present on the prior CT study IMPRESSION: Negative for acute bony abnormality. Sclerotic focus of the right  iliac bone  again demonstrated which was present on prior CT study. Again, metastases not excluded and correlation with a history of malignancy recommended. Electronically Signed   By: Corrie Mckusick D.O.   On: 05/09/2018 17:10   Dg Chest Port 1 View  Result Date: 05/09/2018 CLINICAL DATA:  Possible fall today. Known fall several weeks ago. No current chest pain or shortness of breath. History of hypertension, coronary artery disease, complete heart block. EXAM: PORTABLE CHEST 1 VIEW COMPARISON:  Portable chest x-ray of January 27, 2018 FINDINGS: The lungs are mildly hyperinflated. There is no focal infiltrate, pleural effusion, or pneumothorax. The heart and pulmonary vascularity are normal. The mediastinum is normal in width. There is calcification in the wall of the aortic arch. The ICD is in stable position. The observed bony thorax exhibits no acute abnormality. IMPRESSION: COPD. No pneumonia, CHF, nor other acute cardiopulmonary abnormality. No evidence of acute post traumatic injury of the thorax. Electronically Signed   By: David  Martinique M.D.   On: 05/09/2018 17:06    Pending Labs Unresulted Labs (From admission, onward)    Start     Ordered   05/10/18 0093  Basic metabolic panel  Tomorrow morning,   R     05/09/18 1944   05/10/18 0500  CBC  Tomorrow morning,   R     05/09/18 1944   05/09/18 2119  Lactic acid, plasma  STAT Now then every 3 hours,   STAT     05/09/18 2118   05/09/18 2119  Procalcitonin  ONCE - STAT,   R     05/09/18 2118   05/09/18 1941  Culture, blood (Routine X 2) w Reflex to ID Panel  BLOOD CULTURE X 2,   R    Comments:  Please obtain prior to antibiotic administration.    05/09/18 1940   05/09/18 1940  Urine Culture  Add-on,   R     05/09/18 1940          Vitals/Pain Today's Vitals   05/09/18 1745 05/09/18 1800 05/09/18 1915 05/09/18 1930  BP: (!) 169/73 (!) 166/71 (!) 142/87 (!) 156/76  Resp: 17 (!) 22 15 20   Temp:      TempSrc:      SpO2:      PainSc:         Isolation Precautions No active isolations  Medications Medications  amLODipine (NORVASC) tablet 5 mg (has no administration in time range)  atorvastatin (LIPITOR) tablet 20 mg (has no administration in time range)  carvedilol (COREG) tablet 12.5 mg (has no administration in time range)  polyethylene glycol (MIRALAX / GLYCOLAX) packet 17 g (has no administration in time range)  finasteride (PROSCAR) tablet 5 mg (has no administration in time range)  brimonidine-timolol (COMBIGAN) 0.2-0.5 % ophthalmic solution 1 drop (has no administration in time range)  timolol (TIMOPTIC-XR) 0.5 % ophthalmic gel-forming 1 drop (has no administration in time range)  sodium chloride 0.9 % bolus 500 mL (has no administration in time range)  0.9 %  sodium chloride infusion (has no administration in time range)  insulin aspart (novoLOG) injection 0-9 Units (has no administration in time range)  insulin aspart (novoLOG) injection 0-5 Units (has no administration in time range)  heparin injection 5,000 Units (has no administration in time range)  acetaminophen (TYLENOL) tablet 650 mg (has no administration in time range)    Or  acetaminophen (TYLENOL) suppository 650 mg (has no administration in time range)  senna-docusate (Senokot-S) tablet 1 tablet (has no administration  in time range)  ondansetron (ZOFRAN) tablet 4 mg (has no administration in time range)    Or  ondansetron (ZOFRAN) injection 4 mg (has no administration in time range)  hydrALAZINE (APRESOLINE) injection 5 mg (has no administration in time range)  zolpidem (AMBIEN) tablet 5 mg (has no administration in time range)  sodium chloride 0.9 % bolus 500 mL (0 mLs Intravenous Stopped 05/09/18 1937)  sodium chloride 0.9 % bolus 1,000 mL (0 mLs Intravenous Stopped 05/09/18 2057)  dextrose 50 % solution 25 g (25 g Intravenous Given 05/09/18 1943)  insulin aspart (novoLOG) injection 5 Units (5 Units Intravenous Given 05/09/18 1943)  sodium  polystyrene (KAYEXALATE) 15 GM/60ML suspension 45 g (45 g Oral Given 05/09/18 1943)  calcium gluconate 1 g/ 50 mL sodium chloride IVPB (1,000 mg Intravenous New Bag/Given 05/09/18 2012)    Mobility walks with device

## 2018-05-09 NOTE — Progress Notes (Signed)
CRITICAL VALUE ALERT  Critical Value: LACTIC ACID =4.1  Date & Time Notied:  05/11/2018 2350  Provider Notified: Mid-Level Raliegh Ip Schorr   Orders Received/Actions taken:

## 2018-05-09 NOTE — H&P (Addendum)
History and Physical    [name]. UKG:[id number] DOB: [birth year] DOA: 05/09/2018  Referring MD/NP/PA:   PCP: Haywood Pao, MD   Patient coming from:  The patient is coming from home.  At baseline, pt is independent for most of ADL.        Chief Complaint: AMS, fall and back pain  HPI: Mario Warner. is a 84 y.o. male with medical history significant of hypertension, hyperlipidemia, diet-controlled diabetes, gout, SDH, atrial flutter not on anticoagulants, prostate cancer, CAD, pacemaker placement due to third-degree AV block, peptic ulcer disease, chronic back pain, who presents with altered mental status, fall and back pain.  Patient has AMS, and is unable to provide accurate medical history, therefore, most of the history is obtained by discussing the case with ED physician, per EMS report, and with the nursing staff.  Per report, neighbor heard pt fall earlier today (unsure of exact time). Pt was found to be confused. Per report, pt is normally alert and oriented X4.  Patient complains of low back pain. When I saw pt in ED, patient knows his own name, but is not orientated to time, person the place. No acute respiratory distress, shortness breath, active cough, nausea, vomiting, diarrhea notated.  He moves all extremities. He has dry mucous and membrane.  No facial droop or slurred speech.  ED Course: pt was found to have WBC 7.8, negative urinalysis, CK 81, lactic acid 3.36, INR 1.05, negative troponin, potassium 6.0, worsening renal function, temperature 99.8, oxygen saturation 96% on room air. Chest x-ray showed COPD, without infiltration.  CT of head is negative for acute intracranial abnormalities.  CT of C-spine showed degenerative disc disease.  CT of L-spine showed progressive metastasized bone disease and degenerative disc disease, no acute bony fracture.  X-ray of pelvis is negative for bony fracture, but showed metastasized to bone disease.  Patient is placed on  telemetry bed for observation.  Review of Systems: Could not be reviewed due to altered mental status.   Allergy: No Known Allergies  Past Medical History:  Diagnosis Date  . Atrial flutter ([place])    chronically anticoagulated with coumadin  . Benign prostatic hypertrophy   . BENIGN PROSTATIC HYPERTROPHY, HX OF 04/01/2010   Qualifier: Diagnosis of  By: Selena Batten CMA, Jewel    . Bradycardia    s/p PPM by Dr Rayann Heman  . Bright red blood per rectum 12/26/2011  . CAD (coronary artery disease)   . Complete heart block ([place]) 02/05/2011  . DEGENERATIVE JOINT DISEASE 04/01/2010   Qualifier: Diagnosis of  By: Selena Batten CMA, Jewel    . [place] DISEASE, LUMBAR 04/01/2010   Qualifier: Diagnosis of  By: Selena Batten CMA, Jewel    . DJD (degenerative joint disease)   . DJD (degenerative joint disease)   . DM (diabetes mellitus) ([place])   . DM (diabetes mellitus) ([place])   . Glaucoma   . Gout   . HTN (hypertension)   . Hypercholesterolemia   . Hyperlipidemia   . Impotence   . Lumbar back pain   . Lumbar disc disease   . OA (osteoarthritis) of knee   . Pacemaker   . PC (prostate cancer) ([place])   . Peptic ulcer   . Subdural hematoma ([place]) 12/24/2011  . Syncope 12/24/2011    Past Surgical History:  Procedure Laterality Date  . CATARACT EXTRACTION  12/2010  . HEMORRHOID SURGERY    . INSERT / REPLACE / REMOVE PACEMAKER    . PACEMAKER INSERTION  5/12  by Dr Rayann Heman - [employer] device  . TRANSURETHRAL RESECTION OF PROSTATE      Social History:  reports that he quit smoking about 47 years ago. He has never used smokeless tobacco. He reports that he does not drink alcohol or use drugs.  Family History:  Family History  Problem Relation Age of Onset  . Diabetes Mother   . Blindness Mother   . Diabetes Father      Prior to Admission medications   Medication Sig Start Date End Date Taking? Authorizing Provider  amLODipine (NORVASC) 5 MG tablet Take 1 tablet (5 mg total) by mouth daily. 12/27/11 01/27/18   Tisovec, Fransico Him, MD  atorvastatin (LIPITOR) 20 MG tablet Take 20 mg by mouth daily.      [provider]  carvedilol (COREG) 12.5 MG tablet Take 1 tablet (12.5 mg total) by mouth 2 (two) times daily with a meal. 01/31/18   Allred, Jeneen Rinks, MD  COMBIGAN 0.2-0.5 % ophthalmic solution INSTILL 1 DROP IN OU BID 01/27/15   [provider]  finasteride (PROSCAR) 5 MG tablet Take 5 mg by mouth daily. 04/19/18   [provider]  polyethylene glycol (MIRALAX / GLYCOLAX) packet Take 17 g by mouth daily. 01/27/18   Curatolo, Adam, DO  timolol (TIMOPTIC-XR) 0.5 % ophthalmic gel-forming INSTILL 1 DROP IN [facility] EYE Q DAY 01/16/15   [provider]    Physical Exam: Vitals:   05/09/18 1800 05/09/18 1845 05/09/18 1915 05/09/18 1930  BP: (!) 166/71 (!) 164/72 (!) 142/87 (!) 156/76  Resp: (!) 22 16 15 20   Temp:      TempSrc:      SpO2:       General: Not in acute distress.  Dry mucous and membrane. HEENT:       Eyes: PERRL, EOMI, no scleral icterus.       ENT: No discharge from the ears and nose, no pharynx injection, no tonsillar enlargement.        Neck: No JVD, no bruit, no mass felt. Heme: No neck lymph node enlargement. Cardiac: S1/S2, RRR, No murmurs, No gallops or rubs. Respiratory: No rales, wheezing, rhonchi or rubs. GI: Soft, nondistended, nontender, no rebound pain, no organomegaly, BS present. GU: No hematuria Ext: No pitting leg edema bilaterally. 2+DP/PT pulse bilaterally. Musculoskeletal: has lower back tenderness  Skin: No rashes.  Neuro: confused, knows his own name, not oriented X3, cranial nerves II-XII grossly intact, moves all extremities. Psych: Patient is not psychotic, no suicidal or hemocidal ideation.  Labs on Admission: I have personally reviewed following labs and imaging studies  CBC: Recent Labs  Lab 05/09/18 1605  WBC 7.8  HGB 12.0*  HCT 36.9*  MCV 88.3  PLT 867*   Basic Metabolic Panel: Recent Labs  Lab 05/09/18 1605  NA  134*  K 6.0*  CL 103  CO2 23  GLUCOSE 205*  BUN 42*  CREATININE 2.23*  CALCIUM 9.5   GFR: CrCl cannot be calculated (Unknown ideal weight.). Liver Function Tests: Recent Labs  Lab 05/09/18 1605  AST 26  ALT 13  ALKPHOS 79  BILITOT 0.4  PROT 7.2  ALBUMIN 3.6   Recent Labs  Lab 05/09/18 1605  LIPASE 29   No results for input(s): AMMONIA in the last 168 hours. Coagulation Profile: Recent Labs  Lab 05/09/18 1605  INR 1.05   Cardiac Enzymes: Recent Labs  Lab 05/09/18 1605  CKTOTAL 89   BNP (last 3 results) No results for input(s): PROBNP in  the last 8760 hours. HbA1C: No results for input(s): HGBA1C in the last 72 hours. CBG: No results for input(s): GLUCAP in the last 168 hours. Lipid Profile: No results for input(s): CHOL, HDL, LDLCALC, TRIG, CHOLHDL, LDLDIRECT in the last 72 hours. Thyroid Function Tests: No results for input(s): TSH, T4TOTAL, FREET4, T3FREE, THYROIDAB in the last 72 hours. Anemia Panel: No results for input(s): VITAMINB12, FOLATE, FERRITIN, TIBC, IRON, RETICCTPCT in the last 72 hours. Urine analysis:    Component Value Date/Time   COLORURINE YELLOW 05/09/2018 1938   APPEARANCEUR HAZY (A) 05/09/2018 1938   LABSPEC 1.017 05/09/2018 1938   PHURINE 6.0 05/09/2018 1938   GLUCOSEU NEGATIVE 05/09/2018 1938   HGBUR NEGATIVE 05/09/2018 1938   BILIRUBINUR NEGATIVE 05/09/2018 1938   KETONESUR NEGATIVE 05/09/2018 1938   PROTEINUR 30 (A) 05/09/2018 1938   UROBILINOGEN 1.0 12/24/2011 1413   NITRITE NEGATIVE 05/09/2018 1938   LEUKOCYTESUR NEGATIVE 05/09/2018 1938   Sepsis Labs: @LABRCNTIP (procalcitonin:4,lacticidven:4) )No results found for this or any previous visit (from the past 240 hour(s)).   Radiological Exams on Admission: Ct Head Wo Contrast  Result Date: 05/09/2018 CLINICAL DATA:  84 year old male post fall.  Initial encounter. EXAM: CT HEAD WITHOUT CONTRAST CT CERVICAL SPINE WITHOUT CONTRAST TECHNIQUE: Multidetector CT imaging of  the head and cervical spine was performed following the standard protocol without intravenous contrast. Multiplanar CT image reconstructions of the cervical spine were also generated. COMPARISON:  12/26/2011 head CT.  No comparison cervical spine CT. FINDINGS: CT HEAD FINDINGS Brain: No intracranial hemorrhage or CT evidence of large acute infarct. Chronic microvascular changes. Global atrophy. No intracranial mass lesion noted on this unenhanced exam. Vascular: Vascular calcifications Skull: No skull fracture Sinuses/Orbits: Post lens replacement. No acute orbital abnormality. Minimal mucosal thickening left maxillary sinus. Other: Mastoid air cells and middle ear cavities are clear. CT CERVICAL SPINE FINDINGS Alignment: Mild curvature cervical spine. Slight retrolisthesis C6 and anterolisthesis C7 most likely secondary to facet degenerative changes. Skull base and vertebrae: No cervical spine fracture. Soft tissues: No abnormal prevertebral soft tissue swelling. Disc levels: Multilevel cervical spondylotic changes with various degrees of spinal stenosis and foraminal narrowing. Spinal stenosis most notable C3-4, C5-6 and C6-7. Upper chest: No worrisome abnormality. Other: No worrisome abnormality. IMPRESSION: Head CT: 1. No skull fracture or intracranial hemorrhage. 2. Chronic microvascular changes and atrophy. Cervical spine CT: 1. Mild curvature cervical spine. Slight retrolisthesis C6 and anterolisthesis C7 most likely secondary to facet degenerative changes. 2. No cervical spine fracture or abnormal prevertebral soft tissue swelling noted. 3. Multilevel cervical spondylotic changes with various degrees of spinal stenosis and foraminal narrowing. Spinal stenosis most notable C3-4, C5-6 and C6-7. Electronically Signed   By: Genia Del M.D.   On: 05/09/2018 18:24   Ct Cervical Spine Wo Contrast  Result Date: 05/09/2018 CLINICAL DATA:  84 year old male post fall.  Initial encounter. EXAM: CT HEAD WITHOUT  CONTRAST CT CERVICAL SPINE WITHOUT CONTRAST TECHNIQUE: Multidetector CT imaging of the head and cervical spine was performed following the standard protocol without intravenous contrast. Multiplanar CT image reconstructions of the cervical spine were also generated. COMPARISON:  12/26/2011 head CT.  No comparison cervical spine CT. FINDINGS: CT HEAD FINDINGS Brain: No intracranial hemorrhage or CT evidence of large acute infarct. Chronic microvascular changes. Global atrophy. No intracranial mass lesion noted on this unenhanced exam. Vascular: Vascular calcifications Skull: No skull fracture Sinuses/Orbits: Post lens replacement. No acute orbital abnormality. Minimal mucosal thickening left maxillary sinus. Other: Mastoid air cells and middle ear cavities  are clear. CT CERVICAL SPINE FINDINGS Alignment: Mild curvature cervical spine. Slight retrolisthesis C6 and anterolisthesis C7 most likely secondary to facet degenerative changes. Skull base and vertebrae: No cervical spine fracture. Soft tissues: No abnormal prevertebral soft tissue swelling. Disc levels: Multilevel cervical spondylotic changes with various degrees of spinal stenosis and foraminal narrowing. Spinal stenosis most notable C3-4, C5-6 and C6-7. Upper chest: No worrisome abnormality. Other: No worrisome abnormality. IMPRESSION: Head CT: 1. No skull fracture or intracranial hemorrhage. 2. Chronic microvascular changes and atrophy. Cervical spine CT: 1. Mild curvature cervical spine. Slight retrolisthesis C6 and anterolisthesis C7 most likely secondary to facet degenerative changes. 2. No cervical spine fracture or abnormal prevertebral soft tissue swelling noted. 3. Multilevel cervical spondylotic changes with various degrees of spinal stenosis and foraminal narrowing. Spinal stenosis most notable C3-4, C5-6 and C6-7. Electronically Signed   By: Genia Del M.D.   On: 05/09/2018 18:24   Ct Lumbar Spine Wo Contrast  Result Date:  05/09/2018 CLINICAL DATA:  Fall today.  Back pain.  History of prostate cancer. EXAM: CT LUMBAR SPINE WITHOUT CONTRAST TECHNIQUE: Multidetector CT imaging of the lumbar spine was performed without intravenous contrast administration. Multiplanar CT image reconstructions were also generated. COMPARISON:  CT abdomen pelvis 01/27/2018 FINDINGS: Segmentation: Normal segmentation. Small ribs at T12.  S1 is partially sacralized. Alignment: 8 mm anterolisthesis L3-4.  3 mm retrolisthesis L4-5. Vertebrae: 2.3 cm sclerotic lesion L1 vertebral body on the left is increased in size since the prior CT abdomen pelvis and is consistent with metastatic disease. Sclerotic lesion right medial iliac bone incompletely evaluated but was present previously. Negative for lumbar fracture. Paraspinal and other soft tissues: Atherosclerotic aorta and iliacs without aneurysm. No retroperitoneal adenopathy. Disc levels: T11-12: Schmorl's node superior endplate of M62. Mild disc and facet degeneration without significant stenosis T12-L1: Broad-based disc protrusion. Bilateral facet degeneration. Moderate subarticular and foraminal stenosis bilaterally. Spinal canal adequate in size. L1-2: Moderate disc bulging. Moderate facet hypertrophy. Mild spinal stenosis. Mild subarticular stenosis bilaterally L2-3: Diffuse bulging of the disc. Moderate facet hypertrophy. Moderate spinal stenosis. Moderate subarticular and foraminal stenosis bilaterally L3-4: 8 mm anterolisthesis. Diffuse disc bulging. Severe facet hypertrophy. Probable laminectomy at this level in the past. Severe spinal stenosis. Severe subarticular and foraminal stenosis bilaterally with impingement of the L3 and L4 nerve roots bilaterally. L4-5: 3 mm retrolisthesis. Diffuse bulging of the disc. Moderate facet hypertrophy. Moderate spinal stenosis. Moderate subarticular stenosis bilaterally L5-S1: L5 transverse processes articulate with the sacrum. No significant degenerative change.  IMPRESSION: 1. Sclerotic lesion L1 vertebral body with progression since CT of 01/27/2018 compatible with progressive prostate cancer metastasis. Sclerotic lesion right medial iliac bone also consistent with metastatic disease although incompletely evaluated on the study. 2. Extensive multilevel lumbar degenerative change as above. Multilevel spinal and subarticular stenosis, most severe at L2-3, L3-4, L4-5. Electronically Signed   By: Franchot Gallo M.D.   On: 05/09/2018 18:28   Dg Pelvis Portable  Result Date: 05/09/2018 CLINICAL DATA:  84 year old male with a history of fall EXAM: PORTABLE PELVIS 1-2 VIEWS COMPARISON:  CT 01/27/2018 FINDINGS: Osteopenia. Bony pelvic ring intact with no acute fracture line identified. Bilateral hips projects normally over the acetabula. Unremarkable proximal femurs. Pelvic phleboliths. Degenerative changes of the spine. Sclerotic focus of the right iliac bone again demonstrated, present on the prior CT study IMPRESSION: Negative for acute bony abnormality. Sclerotic focus of the right iliac bone again demonstrated which was present on prior CT study. Again, metastases not excluded and correlation  with a history of malignancy recommended. Electronically Signed   By: Corrie Mckusick D.O.   On: 05/09/2018 17:10   Dg Chest Port 1 View  Result Date: 05/09/2018 CLINICAL DATA:  Possible fall today. Known fall several weeks ago. No current chest pain or shortness of breath. History of hypertension, coronary artery disease, complete heart block. EXAM: PORTABLE CHEST 1 VIEW COMPARISON:  Portable chest x-ray of January 27, 2018 FINDINGS: The lungs are mildly hyperinflated. There is no focal infiltrate, pleural effusion, or pneumothorax. The heart and pulmonary vascularity are normal. The mediastinum is normal in width. There is calcification in the wall of the aortic arch. The ICD is in stable position. The observed bony thorax exhibits no acute abnormality. IMPRESSION: COPD. No  pneumonia, CHF, nor other acute cardiopulmonary abnormality. No evidence of acute post traumatic injury of the thorax. Electronically Signed   By: David  Martinique M.D.   On: 05/09/2018 17:06     EKG: Independently reviewed.  Paced rhythm, QTC 469  Assessment/Plan Principal Problem:   Acute metabolic encephalopathy Active Problems:   Type II diabetes mellitus with renal manifestations (HCC)   HLD (hyperlipidemia)   Essential hypertension   DISC DISEASE, LUMBAR   BPH (benign prostatic hyperplasia)   Atrial flutter ([place])   Fall   Acute renal failure superimposed on stage 3 chronic kidney disease (HCC)   Hyperkalemia   Lactic acid acidosis   Prostate cancer metastatic to bone (HCC)   Dehydration   Acute metabolic encephalopathy: Etiology is not clear.  Urinalysis negative.  CT head is negative for acute intracranial abnormalities.  Patient does not have focal neurologic findings on physical examination, does not seem to have signs of stroke.  Patient has pacemaker, cannot do MRI for brain.  Patient has elevated lactic acid 3.36, but no leukocytosis or fever, clinically does not seem to have sepsis.  Possibly due to multifactorial etiology, including dehydration, worsening renal function, electrolytes disturbance, hyperkalemia, delirium.  -Placed on telemetry bed for observation - Frequent neuro check - Treat hyperkalemia and worsening renal function -IVF Hydration  Fall: No acute bony fracture on images.  CT head is negative for acute intracranial abnormalities.  CT of C-spine showed degenerative disc disease and metastasized to bone disease. -PT/OT  -Consult social work and Tourist information centre manager for possible SNF placement  Dehydration: -IVF: total of 2.0 L of NS, then 150 cc/h  Lactic acid acidosis: Lactic acid 3.36.  Patient does not have fever or leukocytosis, clinically does not seem to have sepsis.  Likely due to dehydration. -will get Procalcitonin and trend lactic acid levels  -IVF:  2L of NS bolus in ED, followed by 125 cc/h  -f/u Bx  Hyperkalemia: Potassium 6.0.  -treated with D50, 5 unit of Novolog by IV, 1g of calcium chloride, 45 g of Kayexalate -f/u by BMP  AoCKD-III: Baseline Cre is 1.4 to 1.5, pt's Cre is 2.23 and BUN 42 on admission. Likely due to prerenal secondary to dehydration. - IVF as above - Follow up renal function by BMP  Diet controled type II diabetes mellitus with renal manifestations ([place]): Last A1c 6.4 on 12/24/11, well controled. Patient is not taking meds at home. Blood sugar 205 -SSI  HLD (hyperlipidemia): -Lipitro  Essential hypertension: -IV Hydralazine prn -Continue home medications: Amlodipine, Coreg,  Back pain and DISC DISEASE, LUMBAR: -percocet and tylenol prn  Hx of Atrial flutter ([place]): CHA2DS2-VASc Score is 5, needs oral anticoagulation, but patient is not on AC. Per his cardiologist, Dr. Jackalyn [facility] clinic  note on 04/07/2015, "I have spoken with Dr Osborne Casco previously and given advanced age and patient preference, we will not anticoagulate this patient".  -continue Coreg.  Prostate cancer metastatic to bone and BPH (benign prostatic hyperplasia): Imaging showed progressive bone metastasized disease. -continue Proscar -Check PSA   DVT ppx: SQ Heparin    Code Status: Full code Family Communication: None at bed side.   Disposition Plan:  To be determined Consults called:  none Admission status: Obs / tele    Date of Service 05/09/2018    Ivor Costa [facility] Pager [phone]  If 7PM-7AM, please contact night-coverage [email or web address] Password [email or web address] 05/09/2018, 9:59 PM

## 2018-05-09 NOTE — ED Triage Notes (Signed)
Pt arrives via [facility] with AMS. EMS reports neighbor heard pt fall earlier today (unsure of exact time) and state pt is normally alert and oriented X4. Pt arrives to the ED alert to self only. C/o lower back pain. No obvious injury noted. Paced rhythm. 98/44, HR 60, RR 20, CBG 260. 20g LAC.

## 2018-05-10 DIAGNOSIS — I4892 Unspecified atrial flutter: Secondary | ICD-10-CM

## 2018-05-10 LAB — BASIC METABOLIC PANEL
Anion gap: 5 (ref 5–15)
BUN: 34 mg/dL — ABNORMAL HIGH (ref 8–23)
CHLORIDE: 113 mmol/L — AB (ref 98–111)
CO2: 22 mmol/L (ref 22–32)
Calcium: 8.5 mg/dL — ABNORMAL LOW (ref 8.9–10.3)
Creatinine, Ser: 1.79 mg/dL — ABNORMAL HIGH (ref 0.61–1.24)
GFR calc Af Amer: 36 mL/min — ABNORMAL LOW (ref >60)
GFR, EST NON AFRICAN AMERICAN: 31 mL/min — AB (ref >60)
Glucose, Bld: 147 mg/dL — ABNORMAL HIGH (ref 70–99)
POTASSIUM: 5 mmol/L (ref 3.5–5.1)
Sodium: 140 mmol/L (ref 135–145)

## 2018-05-10 LAB — LACTIC ACID, PLASMA
LACTIC ACID, VENOUS: 2 mmol/L — AB (ref 0.5–1.9)
Lactic Acid, Venous: 2.4 mmol/L (ref 0.5–1.9)
Lactic Acid, Venous: 1.8 mmol/L (ref 0.5–1.9)

## 2018-05-10 LAB — CBC
HEMATOCRIT: 29.5 % — AB (ref 39.0–52.0)
HEMOGLOBIN: 9.6 g/dL — AB (ref 13.0–17.0)
MCH: 28.7 pg (ref 26.0–34.0)
MCHC: 32.5 g/dL (ref 30.0–36.0)
MCV: 88.1 fL (ref 80.0–100.0)
NRBC: 0 % (ref 0.0–0.2)
Platelets: 128 10*3/uL — ABNORMAL LOW (ref 150–400)
RBC: 3.35 MIL/uL — ABNORMAL LOW (ref 4.22–5.81)
RDW: 12.6 % (ref 11.5–15.5)
WBC: 8.4 10*3/uL (ref 4.0–10.5)

## 2018-05-10 LAB — GLUCOSE, CAPILLARY
GLUCOSE-CAPILLARY: 166 mg/dL — AB (ref 70–99)
Glucose-Capillary: 117 mg/dL — ABNORMAL HIGH (ref 70–99)
Glucose-Capillary: 139 mg/dL — ABNORMAL HIGH (ref 70–99)
Glucose-Capillary: 93 mg/dL (ref 70–99)

## 2018-05-10 LAB — URINE CULTURE: CULTURE: NO GROWTH

## 2018-05-10 LAB — MRSA PCR SCREENING: MRSA by PCR: NEGATIVE

## 2018-05-10 MED ORDER — SODIUM CHLORIDE 0.9 % IV BOLUS: 500.0000 mL | Freq: Once | INTRAVENOUS | Status: DC

## 2018-05-10 MED ORDER — SODIUM CHLORIDE 0.9 % IV BOLUS
500.0000 mL | Freq: Once | INTRAVENOUS | Status: AC
  Administered 2018-05-10: 500 mL via INTRAVENOUS

## 2018-05-10 NOTE — Evaluation (Signed)
Physical Therapy Evaluation Patient Details Name: Mario Warner. MRN: [id number] DOB: 1917-01-15 Today's Date: 05/10/2018   History of Present Illness  84 y.o. male with medical history significant of hypertension, hyperlipidemia, diet-controlled diabetes, gout, SDH, atrial flutter not on anticoagulants, prostate cancer, CAD, pacemaker placement due to third-degree AV block, peptic ulcer disease, chronic back pain, who presents with altered mental status, fall and back pain.  Clinical Impression  Pt presents with impairments in gait, balance, strength and cognition and will benefit from skilled PT to address deficits and improve functional mobility to reduce burden of care for safe d/c to SNF    Follow Up Recommendations SNF    Equipment Recommendations  None recommended by PT    Recommendations for Other Services       Precautions / Restrictions Precautions Precautions: Fall Restrictions Weight Bearing Restrictions: No      Mobility  Bed Mobility Overal bed mobility: Needs Assistance Bed Mobility: Supine to Sit     Supine to sit: Min assist     General bed mobility comments: pt requires assist for trunk, increased time  Transfers Overall transfer level: Needs assistance Equipment used: Rolling walker (2 wheeled) Transfers: Sit to/from Stand Sit to Stand: Min assist         General transfer comment: attempted to stand without RW, pt requires max A. with RW pt able to perform sit <> stand multple attempts with min A  Ambulation/Gait Ambulation/Gait assistance: Mod assist Gait Distance (Feet): 20 Feet Assistive device: Rolling walker (2 wheeled)       General Gait Details: pt able to perform gait in room with mod A for controlling RW ? visual deficits, mod cues for safety, min A for balance  Stairs            Wheelchair Mobility    Modified Rankin (Stroke Patients Only)       Balance Overall balance assessment: Needs assistance Sitting-balance  support: Feet supported Sitting balance-Leahy Scale: Good Sitting balance - Comments: supervision for sitting balance while eating breakfast   Standing balance support: During functional activity Standing balance-Leahy Scale: Poor                               Pertinent Vitals/Pain Pain Assessment: No/denies pain    Home Living Family/patient expects to be discharged to:: Private residence Living Arrangements: Alone Available Help at Discharge: Family;Available PRN/intermittently Type of Home: Apartment Home Access: Stairs to enter     Home Layout: One level Home Equipment: Cane - single point Additional Comments: unable to get full hisotry as pt is an unreliable historian    Prior Function Level of Independence: Independent with assistive device(s)         Comments: Pt verbalizes he has "people in and out to help if needed"     Hand Dominance   Dominant Hand: Right    Extremity/Trunk Assessment   Upper Extremity Assessment Upper Extremity Assessment: Generalized weakness    Lower Extremity Assessment Lower Extremity Assessment: Generalized weakness    Cervical / Trunk Assessment Cervical / Trunk Assessment: Kyphotic  Communication   Communication: No difficulties  Cognition Arousal/Alertness: Awake/alert Behavior During Therapy: WFL for tasks assessed/performed Overall Cognitive Status: No family/caregiver present to determine baseline cognitive functioning                                 General  Comments: pt unreliable historian, requires mod cuing for simple questions      General Comments      Exercises     Assessment/Plan    PT Assessment Patient needs continued PT services  PT Problem List Decreased strength;Decreased mobility;Decreased safety awareness;Decreased coordination;Decreased activity tolerance;Decreased cognition;Cardiopulmonary status limiting activity;Decreased balance;Decreased knowledge of use of  DME       PT Treatment Interventions DME instruction;Therapeutic activities;Gait training;Cognitive remediation;Therapeutic exercise;Patient/family education;Balance training;Neuromuscular re-education;Functional mobility training;Stair training;Wheelchair mobility training    PT Goals (Current goals can be found in the Care Plan section)  Acute Rehab PT Goals Patient Stated Goal: unable to state PT Goal Formulation: Patient unable to participate in goal setting Time For Goal Achievement: 05/24/18 Potential to Achieve Goals: Good    Frequency Min 2X/week   Barriers to discharge Decreased caregiver support pt lives alone    Co-evaluation               AM-PAC PT "6 Clicks" Mobility  Outcome Measure Help needed turning from your back to your side while in a flat bed without using bedrails?: A Little Help needed moving from lying on your back to sitting on the side of a flat bed without using bedrails?: A Little Help needed moving to and from a bed to a chair (including a wheelchair)?: A Little Help needed standing up from a chair using your arms (e.g., wheelchair or bedside chair)?: A Little Help needed to walk in hospital room?: A Lot Help needed climbing 3-5 steps with a railing? : A Lot 6 Click Score: 16    End of Session   Activity Tolerance: Patient tolerated treatment well Patient left: in bed;with bed alarm set;with call bell/phone within reach;with nursing/sitter in room Nurse Communication: Mobility status PT Visit Diagnosis: Unsteadiness on feet (R26.81);History of falling (Z91.81);Difficulty in walking, not elsewhere classified (R26.2);Muscle weakness (generalized) (M62.81)    Time: 0940-1005 PT Time Calculation (min) (ACUTE ONLY): 25 min   Charges:   PT Evaluation $PT Eval Moderate Complexity: 1 Mod PT Treatments $Therapeutic Activity: 8-22 mins        Isabelle Course, PT, DPT  Dung Prien 05/10/2018, 10:55 AM

## 2018-05-10 NOTE — Progress Notes (Addendum)
PROGRESS NOTE  Mario Warner. XTG:[id number] DOB: [birth year] DOA: 05/09/2018 PCP: Haywood Pao, MD   LOS: 0 days   Brief narrative: Mario Warner. is a 84 y.o. male with medical history significant of hypertension, hyperlipidemia, diet-controlled diabetes, gout, SDH, atrial flutter not on anticoagulants, prostate cancer, CAD, pacemaker placement due to third-degree AV block, peptic ulcer disease, chronic back pain,presented to the hospital  with altered mental status, fall and back pain.Per report, neighbor heard pt fall and the patient was found to be confused. Per report, pt is normally alert and oriented X4.    In the ED,  pt was found to have WBC 7.8, negative urinalysis, CK 81, lactic acid 3.36, INR 1.05, negative troponin, potassium 6.0, worsening renal function, temperature 99.8, oxygen saturation 96% on room air. Chest x-ray showed COPD, without infiltration.  CT of head was negative for acute intracranial abnormalities.  CT of C-spine showed degenerative disc disease.  CT of L-spine showed progressive metastasized bone disease and degenerative disc disease, no acute bony fracture.  X-ray of pelvis was negative for bony fracture, but showed scelerotic bony disease, unable to rule out mets.  to bone disease.    Assessment/Plan:  Principal Problem:   Acute metabolic encephalopathy Active Problems:   Type II diabetes mellitus with renal manifestations (HCC)   HLD (hyperlipidemia)   Essential hypertension   DISC DISEASE, LUMBAR   BPH (benign prostatic hyperplasia)   Atrial flutter (HCC)   Fall   Acute renal failure superimposed on stage 3 chronic kidney disease (HCC)   Hyperkalemia   Lactic acid acidosis   Prostate cancer metastatic to bone (HCC)   Dehydration  Acute metabolic encephalopathy -lactic was elevated on admission but no signs of fever or leukocytosis or sepsis..  Likely encephalopathy from dehydration, worsening renal function, electrolyte abnormalities.   Improving.  Continue gentle IV fluid hydration.  Status post fall No acute bony fracture on images.  CT head is negative for acute intracranial abnormalities.  CT of C-spine showed degenerative disc disease.  Physical therapy/occupational therapy and social workers on board.  Physical therapy and occupational therapy recommend skilled nursing facility placement for rehab.  Dehydration,lactic acidosis.  Continue on IV fluids.  Lactic acidosis improving.  No evidence of sepsis.  Blood cultures negative in 12 hours.  Procalcitonin 0.22.urinalysis is negative.  We will continue gentle IV fluid hydration.  IV fluid rate has been decreased  Hyperkalemia.  Resolved with IV fluids.  Patient initially received temporizing measures. We will closely monitor elect lites.  Decreased IV fluids.  Acute kidney injury on chronic kidney disease stage III.  Baseline creatinine between 1.4-1.5.  Will continue on gentle IV fluid.  Creatinine today is 1.7.  Check BMP closely.  Decrease IV fluids.  Diet controled type II diabetes mellitus with chronic kidney disease.  Last A1c 6.4 on 12/24/11, .  Will continue on sliding scale insulin in the hospital.  Accu-Cheks diabetic diet.  HLD (hyperlipidemia): Continue on statins.  Essential hypertension: Continue on amlodipine, Coreg hydralazine as needed.  Back pain and degenerative joint disease on the lumbar spine.  Continue supportive care -percocet and tylenol prn  Hx of Atrial flutter ([place]): CHA2DS2-VASc Score is 5, and was not considered anticoagulation as per the cardiology from outpatient. Continue Coreg.  Prostate cancer metastatic to bone and BPH (benign prostatic hyperplasia): on Proscar  VTE Prophylaxis: heparin   Code Status:  Full code  Family Communication: None at bedside   Disposition Plan: SNF 2 to 3  days.   Consultants:  None  Procedures:  None  Antibiotics:  None  Subjective: Patient currently denies any back pain.  Denies  shortness of breath cough fever.  Denies nausea vomiting or diarrhea.  Objective: Vitals:   05/10/18 0400 05/10/18 0748  BP: (!) 129/59 (!) 109/47  Pulse: 60 60  Resp: 18 18  Temp: 99 F (37.2 C) 98.2 F (36.8 C)  SpO2: 100% 100%    Intake/Output Summary (Last 24 hours) at 05/10/2018 1211 Last data filed at 05/10/2018 0258 Gross per 24 hour  Intake 1001.44 ml  Output 1 ml  Net 1000.44 ml   There were no vitals filed for this visit. There is no height or weight on file to calculate BMI.  Physical Examination: General exam: Appears calm and comfortable ,Not in distress, lying in bed.  Alert awake oriented x2. HEENT:PERRL,Oral mucosa moist Respiratory system: Bilateral equal air entry, normal vesicular breath sounds, no wheezes or crackles  Cardiovascular system: S1 & S2 heard, RRR.  Gastrointestinal system: Abdomen is nondistended, soft and nontender. No organomegaly or masses felt. Normal bowel sounds heard. Central nervous system: Alert and awake. No focal neurological deficits.  Mild lower back tenderness on palpation. Extremities: No edema, no clubbing ,no cyanosis, distal peripheral pulses palpable. Skin: No rashes, lesions or ulcers,no icterus ,no pallor MSK: Normal muscle bulk,tone ,power  Data Review: I have personally reviewed the following laboratory data and studies,  CBC: Recent Labs  Lab 05/09/18 1605 05/10/18 0508  WBC 7.8 8.4  HGB 12.0* 9.6*  HCT 36.9* 29.5*  MCV 88.3 88.1  PLT 146* 382*   Basic Metabolic Panel: Recent Labs  Lab 05/09/18 1605 05/10/18 0508  NA 134* 140  K 6.0* 5.0  CL 103 113*  CO2 23 22  GLUCOSE 205* 147*  BUN 42* 34*  CREATININE 2.23* 1.79*  CALCIUM 9.5 8.5*   Liver Function Tests: Recent Labs  Lab 05/09/18 1605  AST 26  ALT 13  ALKPHOS 79  BILITOT 0.4  PROT 7.2  ALBUMIN 3.6   Recent Labs  Lab 05/09/18 1605  LIPASE 29   No results for input(s): AMMONIA in the last 168 hours. Cardiac Enzymes: Recent Labs    Lab 05/09/18 1605  CKTOTAL 89   BNP (last 3 results) No results for input(s): BNP in the last 8760 hours.  ProBNP (last 3 results) No results for input(s): PROBNP in the last 8760 hours.  CBG: Recent Labs  Lab 05/10/18 0026 05/10/18 1022  GLUCAP 166* 117*   Recent Results (from the past 240 hour(s))  Culture, blood (Routine X 2) w Reflex to ID Panel     Status: None (Preliminary result)   Collection Time: 05/09/18  8:50 PM  Result Value Ref Range Status   Specimen Description BLOOD RIGHT ANTECUBITAL  Final   Special Requests   Final    BOTTLES DRAWN AEROBIC AND ANAEROBIC Blood Culture adequate volume   Culture   Final    NO GROWTH < 12 HOURS Performed at [facility], [place] [address], [place], [place] [address]    Report Status PENDING  Incomplete  Culture, blood (Routine X 2) w Reflex to ID Panel     Status: None (Preliminary result)   Collection Time: 05/09/18  8:50 PM  Result Value Ref Range Status   Specimen Description BLOOD RIGHT HAND  Final   Special Requests   Final    BOTTLES DRAWN AEROBIC AND ANAEROBIC Blood Culture results may not be optimal due  to an inadequate volume of blood received in culture bottles   Culture   Final    NO GROWTH < 12 HOURS Performed at [place] [address], [place], [place] [address]    Report Status PENDING  Incomplete  MRSA PCR Screening     Status: None   Collection Time: 05/10/18 12:43 AM  Result Value Ref Range Status   MRSA by PCR NEGATIVE NEGATIVE Final    Comment:        The GeneXpert MRSA Assay (FDA approved for NASAL specimens only), is one component of a comprehensive MRSA colonization surveillance program. It is not intended to diagnose MRSA infection nor to guide or monitor treatment for MRSA infections. Performed at [facility], [place] [address] [place],  [address]      Studies: Ct Head Wo Contrast  Result Date: 05/09/2018 CLINICAL DATA:  84 year old male post fall.   Initial encounter. EXAM: CT HEAD WITHOUT CONTRAST CT CERVICAL SPINE WITHOUT CONTRAST TECHNIQUE: Multidetector CT imaging of the head and cervical spine was performed following the standard protocol without intravenous contrast. Multiplanar CT image reconstructions of the cervical spine were also generated. COMPARISON:  12/26/2011 head CT.  No comparison cervical spine CT. FINDINGS: CT HEAD FINDINGS Brain: No intracranial hemorrhage or CT evidence of large acute infarct. Chronic microvascular changes. Global atrophy. No intracranial mass lesion noted on this unenhanced exam. Vascular: Vascular calcifications Skull: No skull fracture Sinuses/Orbits: Post lens replacement. No acute orbital abnormality. Minimal mucosal thickening left maxillary sinus. Other: Mastoid air cells and middle ear cavities are clear. CT CERVICAL SPINE FINDINGS Alignment: Mild curvature cervical spine. Slight retrolisthesis C6 and anterolisthesis C7 most likely secondary to facet degenerative changes. Skull base and vertebrae: No cervical spine fracture. Soft tissues: No abnormal prevertebral soft tissue swelling. Disc levels: Multilevel cervical spondylotic changes with various degrees of spinal stenosis and foraminal narrowing. Spinal stenosis most notable C3-4, C5-6 and C6-7. Upper chest: No worrisome abnormality. Other: No worrisome abnormality. IMPRESSION: Head CT: 1. No skull fracture or intracranial hemorrhage. 2. Chronic microvascular changes and atrophy. Cervical spine CT: 1. Mild curvature cervical spine. Slight retrolisthesis C6 and anterolisthesis C7 most likely secondary to facet degenerative changes. 2. No cervical spine fracture or abnormal prevertebral soft tissue swelling noted. 3. Multilevel cervical spondylotic changes with various degrees of spinal stenosis and foraminal narrowing. Spinal stenosis most notable C3-4, C5-6 and C6-7. Electronically Signed   By: Genia Del M.D.   On: 05/09/2018 18:24   Ct Cervical Spine Wo  Contrast  Result Date: 05/09/2018 CLINICAL DATA:  84 year old male post fall.  Initial encounter. EXAM: CT HEAD WITHOUT CONTRAST CT CERVICAL SPINE WITHOUT CONTRAST TECHNIQUE: Multidetector CT imaging of the head and cervical spine was performed following the standard protocol without intravenous contrast. Multiplanar CT image reconstructions of the cervical spine were also generated. COMPARISON:  12/26/2011 head CT.  No comparison cervical spine CT. FINDINGS: CT HEAD FINDINGS Brain: No intracranial hemorrhage or CT evidence of large acute infarct. Chronic microvascular changes. Global atrophy. No intracranial mass lesion noted on this unenhanced exam. Vascular: Vascular calcifications Skull: No skull fracture Sinuses/Orbits: Post lens replacement. No acute orbital abnormality. Minimal mucosal thickening left maxillary sinus. Other: Mastoid air cells and middle ear cavities are clear. CT CERVICAL SPINE FINDINGS Alignment: Mild curvature cervical spine. Slight retrolisthesis C6 and anterolisthesis C7 most likely secondary to facet degenerative changes. Skull base and vertebrae: No cervical spine fracture. Soft tissues: No abnormal prevertebral soft tissue swelling. Disc  levels: Multilevel cervical spondylotic changes with various degrees of spinal stenosis and foraminal narrowing. Spinal stenosis most notable C3-4, C5-6 and C6-7. Upper chest: No worrisome abnormality. Other: No worrisome abnormality. IMPRESSION: Head CT: 1. No skull fracture or intracranial hemorrhage. 2. Chronic microvascular changes and atrophy. Cervical spine CT: 1. Mild curvature cervical spine. Slight retrolisthesis C6 and anterolisthesis C7 most likely secondary to facet degenerative changes. 2. No cervical spine fracture or abnormal prevertebral soft tissue swelling noted. 3. Multilevel cervical spondylotic changes with various degrees of spinal stenosis and foraminal narrowing. Spinal stenosis most notable C3-4, C5-6 and C6-7.  Electronically Signed   By: Genia Del M.D.   On: 05/09/2018 18:24   Ct Lumbar Spine Wo Contrast  Result Date: 05/09/2018 CLINICAL DATA:  Fall today.  Back pain.  History of prostate cancer. EXAM: CT LUMBAR SPINE WITHOUT CONTRAST TECHNIQUE: Multidetector CT imaging of the lumbar spine was performed without intravenous contrast administration. Multiplanar CT image reconstructions were also generated. COMPARISON:  CT abdomen pelvis 01/27/2018 FINDINGS: Segmentation: Normal segmentation. Small ribs at T12.  S1 is partially sacralized. Alignment: 8 mm anterolisthesis L3-4.  3 mm retrolisthesis L4-5. Vertebrae: 2.3 cm sclerotic lesion L1 vertebral body on the left is increased in size since the prior CT abdomen pelvis and is consistent with metastatic disease. Sclerotic lesion right medial iliac bone incompletely evaluated but was present previously. Negative for lumbar fracture. Paraspinal and other soft tissues: Atherosclerotic aorta and iliacs without aneurysm. No retroperitoneal adenopathy. Disc levels: T11-12: Schmorl's node superior endplate of B51. Mild disc and facet degeneration without significant stenosis T12-L1: Broad-based disc protrusion. Bilateral facet degeneration. Moderate subarticular and foraminal stenosis bilaterally. Spinal canal adequate in size. L1-2: Moderate disc bulging. Moderate facet hypertrophy. Mild spinal stenosis. Mild subarticular stenosis bilaterally L2-3: Diffuse bulging of the disc. Moderate facet hypertrophy. Moderate spinal stenosis. Moderate subarticular and foraminal stenosis bilaterally L3-4: 8 mm anterolisthesis. Diffuse disc bulging. Severe facet hypertrophy. Probable laminectomy at this level in the past. Severe spinal stenosis. Severe subarticular and foraminal stenosis bilaterally with impingement of the L3 and L4 nerve roots bilaterally. L4-5: 3 mm retrolisthesis. Diffuse bulging of the disc. Moderate facet hypertrophy. Moderate spinal stenosis. Moderate  subarticular stenosis bilaterally L5-S1: L5 transverse processes articulate with the sacrum. No significant degenerative change. IMPRESSION: 1. Sclerotic lesion L1 vertebral body with progression since CT of 01/27/2018 compatible with progressive prostate cancer metastasis. Sclerotic lesion right medial iliac bone also consistent with metastatic disease although incompletely evaluated on the study. 2. Extensive multilevel lumbar degenerative change as above. Multilevel spinal and subarticular stenosis, most severe at L2-3, L3-4, L4-5. Electronically Signed   By: Franchot Gallo M.D.   On: 05/09/2018 18:28   Dg Pelvis Portable  Result Date: 05/09/2018 CLINICAL DATA:  84 year old male with a history of fall EXAM: PORTABLE PELVIS 1-2 VIEWS COMPARISON:  CT 01/27/2018 FINDINGS: Osteopenia. Bony pelvic ring intact with no acute fracture line identified. Bilateral hips projects normally over the acetabula. Unremarkable proximal femurs. Pelvic phleboliths. Degenerative changes of the spine. Sclerotic focus of the right iliac bone again demonstrated, present on the prior CT study IMPRESSION: Negative for acute bony abnormality. Sclerotic focus of the right iliac bone again demonstrated which was present on prior CT study. Again, metastases not excluded and correlation with a history of malignancy recommended. Electronically Signed   By: Corrie Mckusick D.O.   On: 05/09/2018 17:10   Dg Chest Port 1 View  Result Date: 05/09/2018 CLINICAL DATA:  Possible fall today. Known fall several weeks  ago. No current chest pain or shortness of breath. History of hypertension, coronary artery disease, complete heart block. EXAM: PORTABLE CHEST 1 VIEW COMPARISON:  Portable chest x-ray of January 27, 2018 FINDINGS: The lungs are mildly hyperinflated. There is no focal infiltrate, pleural effusion, or pneumothorax. The heart and pulmonary vascularity are normal. The mediastinum is normal in width. There is calcification in the wall of  the aortic arch. The ICD is in stable position. The observed bony thorax exhibits no acute abnormality. IMPRESSION: COPD. No pneumonia, CHF, nor other acute cardiopulmonary abnormality. No evidence of acute post traumatic injury of the thorax. Electronically Signed   By: David  Martinique M.D.   On: 05/09/2018 17:06    Scheduled Meds: . amLODipine  5 mg Oral Daily  . atorvastatin  20 mg Oral Daily  . brimonidine  1 drop Both Eyes BID   And  . timolol  1 drop Both Eyes BID  . carvedilol  12.5 mg Oral BID WC  . finasteride  5 mg Oral Daily  . heparin  5,000 Units Subcutaneous Q8H  . insulin aspart  0-5 Units Subcutaneous QHS  . insulin aspart  0-9 Units Subcutaneous TID WC  . polyethylene glycol  17 g Oral Daily   Continuous Infusions: . sodium chloride 100 mL/hr at 05/10/18 1015  . sodium chloride      Time spent: 25 minutes. More than 50% of that time was spent in counseling and/or coordination of care.  Mario Warner  [facility] Pager [phone]  If 7PM-7AM, please contact night-coverage at [email or web address], password [facility] 05/10/2018, 12:11 PM

## 2018-05-10 NOTE — Progress Notes (Signed)
CRITICAL VALUE ALERT  Critical Value: Lactic acid = 2.4  Date & Time Notied:  05/10/2018, 0612  Provider Notified: Raliegh Ip Schorr   Orders Received/Actions taken

## 2018-05-10 NOTE — Evaluation (Signed)
Occupational Therapy Evaluation Patient Details Name: Mario Warner. MRN: [id number] DOB: 03-28-1917 Today's Date: 05/10/2018    History of Present Illness 84 y.o. male with medical history significant of hypertension, hyperlipidemia, diet-controlled diabetes, gout, SDH, atrial flutter not on anticoagulants, prostate cancer, CAD, pacemaker placement due to third-degree AV block, peptic ulcer disease, chronic back pain, who presents with altered mental status, fall and back pain.   Clinical Impression   Patient presenting with decreased I in self care, balance, functional mobility/transfers, cognition, strength, coordination, and safety awareness. No family present during session and pt is likely poor historian.  Patient reports living at home alone and I in all self care tasks and mobility with family and friends "checking in" PTA. Patient currently functioning at min - max A for self care and mod A sit <>stand . Patient will benefit from acute OT to increase overall independence in the areas of ADLs, functional mobility,and safety in order to safely discharge to next venue of care.    Follow Up Recommendations  SNF;Supervision/Assistance - 24 hour    Equipment Recommendations  Other (comment)(defer to next venue of care)    Recommendations for Other Services       Precautions / Restrictions Precautions Precautions: Fall Restrictions Weight Bearing Restrictions: No      Mobility Bed Mobility Overal bed mobility: Needs Assistance Bed Mobility: Sit to Supine     Supine to sit: Min assist     General bed mobility comments: pt requires assist for trunk, increased time  Transfers Overall transfer level: Needs assistance Equipment used: Rolling walker (2 wheeled) Transfers: Sit to/from Stand Sit to Stand: Mod assist         General transfer comment: mod lifting assistane to stand from EOB. Increased time and cuing needing for sequencing and initiation    Balance  Overall balance assessment: Needs assistance Sitting-balance support: Feet supported Sitting balance-Leahy Scale: Good Sitting balance - Comments: supervision for sitting balance while eating breakfast when entering the room   Standing balance support: During functional activity Standing balance-Leahy Scale: Poor        ADL either performed or assessed with clinical judgement   ADL Overall ADL's : Needs assistance/impaired     Grooming: Sitting;Set up;Oral care   Upper Body Bathing: Set up;Sitting       Upper Body Dressing : Moderate assistance;Sitting;Standing   Lower Body Dressing: Moderate assistance;Sit to/from stand   Toilet Transfer: Moderate assistance;BSC   Toileting- Clothing Manipulation and Hygiene: Moderate assistance;Sit to/from stand         General ADL Comments: mod lifting assistance to stand with mod cuing for technique and initiation     Vision Baseline Vision/History: No visual deficits Patient Visual Report: No change from baseline Vision Assessment?: No apparent visual deficits            Pertinent Vitals/Pain Pain Assessment: No/denies pain     Hand Dominance Right   Extremity/Trunk Assessment Upper Extremity Assessment Upper Extremity Assessment: Generalized weakness   Lower Extremity Assessment Lower Extremity Assessment: Generalized weakness   Cervical / Trunk Assessment Cervical / Trunk Assessment: Kyphotic   Communication Communication Communication: No difficulties   Cognition Arousal/Alertness: Awake/alert Behavior During Therapy: WFL for tasks assessed/performed Overall Cognitive Status: No family/caregiver present to determine baseline cognitive functioning     General Comments: pt unreliable historian, requires mod cuing for simple questions              Home Living Family/patient expects to be discharged to:: Private  residence Living Arrangements: Alone Available Help at Discharge: Family;Available  PRN/intermittently Type of Home: Apartment Home Access: Stairs to enter     Home Layout: One level     Bathroom Shower/Tub: Teacher, early years/pre: Standard     Home Equipment: Cane - single point   Additional Comments: unable to get full hisotry as pt is an unreliable historian      Prior Functioning/Environment Level of Independence: Independent with assistive device(s)        Comments: Pt verbalizes he has "people in and out to help if needed"        OT Problem List: Decreased strength;Decreased coordination;Decreased activity tolerance;Decreased cognition;Impaired balance (sitting and/or standing);Decreased safety awareness      OT Treatment/Interventions: Self-care/ADL training;Balance training;Therapeutic exercise;Therapeutic activities;Neuromuscular education;Energy conservation;Cognitive remediation/compensation;DME and/or AE instruction;Patient/family education    OT Goals(Current goals can be found in the care plan section) Acute Rehab OT Goals Patient Stated Goal: unable to state ADL Goals Pt Will Perform Grooming: with supervision Pt Will Perform Lower Body Bathing: with supervision Pt Will Perform Lower Body Dressing: with supervision Pt Will Transfer to Toilet: with supervision Pt Will Perform Toileting - Clothing Manipulation and hygiene: with supervision  OT Frequency: Min 2X/week   Barriers to D/C: Decreased caregiver support  pt lives alone          AM-PAC OT "6 Clicks" Daily Activity     Outcome Measure Help from another person eating meals?: A Little Help from another person taking care of personal grooming?: A Little Help from another person toileting, which includes using toliet, bedpan, or urinal?: A Lot Help from another person bathing (including washing, rinsing, drying)?: A Lot Help from another person to put on and taking off regular upper body clothing?: A Little Help from another person to put on and taking off regular  lower body clothing?: A Lot 6 Click Score: 15   End of Session Nurse Communication: Mobility status  Activity Tolerance: Patient tolerated treatment well Patient left: in bed;with bed alarm set;with call bell/phone within reach  OT Visit Diagnosis: Unsteadiness on feet (R26.81);Repeated falls (R29.6);Muscle weakness (generalized) (M62.81);History of falling (Z91.81)                Time: 8185-6314 OT Time Calculation (min): 16 min Charges:  OT General Charges $OT Visit: 1 Visit OT Evaluation $OT Eval Low Complexity: 1 Low  Gypsy Decant, Ms, OTR/L 05/10/2018, 11:01 AM

## 2018-05-10 NOTE — Progress Notes (Signed)
CRITICAL VALUE ALERT  Critical Value:  Lactic Acid: 2.0  Date & Time Notied:  12:44  Provider Notified: Pokhrel, L  Orders Received/Actions taken: Awaiting orders

## 2018-05-11 DIAGNOSIS — E1129 Type 2 diabetes mellitus with other diabetic kidney complication: Secondary | ICD-10-CM

## 2018-05-11 LAB — GLUCOSE, CAPILLARY
GLUCOSE-CAPILLARY: 148 mg/dL — AB (ref 70–99)
Glucose-Capillary: 76 mg/dL (ref 70–99)
Glucose-Capillary: 101 mg/dL — ABNORMAL HIGH (ref 70–99)
Glucose-Capillary: 159 mg/dL — ABNORMAL HIGH (ref 70–99)

## 2018-05-11 LAB — CBC
HEMATOCRIT: 26.9 % — AB (ref 39.0–52.0)
Hemoglobin: 8.7 g/dL — ABNORMAL LOW (ref 13.0–17.0)
MCH: 28.8 pg (ref 26.0–34.0)
MCHC: 32.3 g/dL (ref 30.0–36.0)
MCV: 89.1 fL (ref 80.0–100.0)
NRBC: 0 % (ref 0.0–0.2)
Platelets: 117 10*3/uL — ABNORMAL LOW (ref 150–400)
RBC: 3.02 MIL/uL — ABNORMAL LOW (ref 4.22–5.81)
RDW: 12.9 % (ref 11.5–15.5)
WBC: 5.2 10*3/uL (ref 4.0–10.5)

## 2018-05-11 LAB — BASIC METABOLIC PANEL
ANION GAP: 5 (ref 5–15)
BUN: 28 mg/dL — AB (ref 8–23)
CO2: 23 mmol/L (ref 22–32)
CREATININE: 1.78 mg/dL — AB (ref 0.61–1.24)
Calcium: 8.2 mg/dL — ABNORMAL LOW (ref 8.9–10.3)
Chloride: 109 mmol/L (ref 98–111)
GFR calc non Af Amer: 31 mL/min — ABNORMAL LOW (ref >60)
GFR, EST AFRICAN AMERICAN: 36 mL/min — AB (ref >60)
GLUCOSE: 98 mg/dL (ref 70–99)
Potassium: 3.8 mmol/L (ref 3.5–5.1)
Sodium: 137 mmol/L (ref 135–145)

## 2018-05-11 LAB — MAGNESIUM: Magnesium: 1.8 mg/dL (ref 1.7–2.4)

## 2018-05-11 NOTE — NC FL2 (Signed)
[place] LEVEL OF CARE SCREENING TOOL     IDENTIFICATION  Patient Name: Mario Warner. Birthdate: 1916-07-14 Sex: male Admission Date (Current Location): 05/09/2018  [facility] and [place] Number:  Herbalist and Address:  [facility], [place] [address], [place], [place] [address]      Provider Number: [id number]  Attending Physician Name and Address:  Flora Lipps, MD  Relative Name and Phone Number:  Vivien Rota, [phone]    Current Level of Care: Hospital Recommended Level of Care: [place] Prior Approval Number:    Date Approved/Denied:   PASRR Number: [id number]  Discharge Plan: SNF    Current Diagnoses: Patient Active Problem List   Diagnosis Date Noted  . Back pain 05/09/2018  . Fall 05/09/2018  . Acute metabolic encephalopathy 62/37/6283  . Acute renal failure superimposed on stage 3 chronic kidney disease ([place]) 05/09/2018  . Hyperkalemia 05/09/2018  . Lactic acid acidosis 05/09/2018  . Prostate cancer metastatic to bone ([place]) 05/09/2018  . Dehydration 05/09/2018  . Bright red blood per rectum 12/26/2011  . Syncope 12/24/2011  . Subdural hematoma ([place]) 12/24/2011  . Complete heart block ([place]) 02/05/2011  . Atrial flutter ([place]) 11/10/2010  . Type II diabetes mellitus with renal manifestations ([place]) 04/01/2010  . HLD (hyperlipidemia) 04/01/2010  . GLAUCOMA 04/01/2010  . Essential hypertension 04/01/2010  . DEGENERATIVE JOINT DISEASE 04/01/2010  . [place] DISEASE, LUMBAR 04/01/2010  . BPH (benign prostatic hyperplasia) 04/01/2010    Orientation RESPIRATION BLADDER Height & Weight     Self, Situation, Place  Normal Incontinent Weight:   Height:     BEHAVIORAL SYMPTOMS/MOOD NEUROLOGICAL BOWEL NUTRITION STATUS      Incontinent Diet(Please see DC Summary)  AMBULATORY STATUS COMMUNICATION OF NEEDS Skin   Limited Assist Verbally Normal                       Personal Care  Assistance Level of Assistance  Bathing, Feeding, Dressing Bathing Assistance: Maximum assistance Feeding assistance: Independent Dressing Assistance: Limited assistance     Functional Limitations Info  Sight, Hearing, Speech Sight Info: Adequate Hearing Info: Adequate Speech Info: Adequate    SPECIAL CARE FACTORS FREQUENCY  PT (By licensed PT), OT (By licensed OT)     PT Frequency: 5x/week OT Frequency: 3x/week            Contractures Contractures Info: Not present    Additional Factors Info  Code Status, Allergies, Insulin Sliding Scale Code Status Info: Full Allergies Info: NKA   Insulin Sliding Scale Info: 3x daily with meals and at bedtime       Current Medications (05/11/2018):  This is the current hospital active medication list Current Facility-Administered Medications  Medication Dose Route Frequency Provider Last Rate Last Dose  . acetaminophen (TYLENOL) tablet 650 mg  650 mg Oral Q6H PRN Ivor Costa, MD       Or  . acetaminophen (TYLENOL) suppository 650 mg  650 mg Rectal Q6H PRN Ivor Costa, MD      . amLODipine (NORVASC) tablet 5 mg  5 mg Oral Daily Ivor Costa, MD   5 mg at 05/11/18 1517  . atorvastatin (LIPITOR) tablet 20 mg  20 mg Oral Daily Ivor Costa, MD   20 mg at 05/11/18 6160  . brimonidine (ALPHAGAN) 0.2 % ophthalmic solution 1 drop  1 drop Both Eyes BID Ivor Costa, MD   1 drop at 05/11/18 0943   And  .  timolol (TIMOPTIC) 0.5 % ophthalmic solution 1 drop  1 drop Both Eyes BID Ivor Costa, MD   1 drop at 05/11/18 0944  . carvedilol (COREG) tablet 12.5 mg  12.5 mg Oral BID WC Ivor Costa, MD   12.5 mg at 05/11/18 2025  . finasteride (PROSCAR) tablet 5 mg  5 mg Oral Daily Ivor Costa, MD   5 mg at 05/11/18 0832  . heparin injection 5,000 Units  5,000 Units Subcutaneous Q8H Ivor Costa, MD   5,000 Units at 05/11/18 0507  . hydrALAZINE (APRESOLINE) injection 5 mg  5 mg Intravenous Q2H PRN Ivor Costa, MD      . insulin aspart (novoLOG) injection 0-5 Units   0-5 Units Subcutaneous QHS Ivor Costa, MD   Stopped at 05/10/18 2217  . insulin aspart (novoLOG) injection 0-9 Units  0-9 Units Subcutaneous TID WC Ivor Costa, MD   1 Units at 05/10/18 1713  . ondansetron (ZOFRAN) tablet 4 mg  4 mg Oral Q6H PRN Ivor Costa, MD       Or  . ondansetron [facility]) injection 4 mg  4 mg Intravenous Q6H PRN Ivor Costa, MD      . oxyCODONE-acetaminophen (PERCOCET/ROXICET) 5-325 MG per tablet 1 tablet  1 tablet Oral Q4H PRN Ivor Costa, MD   1 tablet at 05/10/18 2229  . polyethylene glycol (MIRALAX / GLYCOLAX) packet 17 g  17 g Oral Daily Ivor Costa, MD   17 g at 05/10/18 1042  . senna-docusate (Senokot-S) tablet 1 tablet  1 tablet Oral QHS PRN Ivor Costa, MD      . sodium chloride 0.9 % bolus 500 mL  500 mL Intravenous Once Schorr, Rhetta Mura, NP      . zolpidem (AMBIEN) tablet 5 mg  5 mg Oral QHS PRN Ivor Costa, MD         Discharge Medications: Please see discharge summary for a list of discharge medications.  Relevant Imaging Results:  Relevant Lab Results:   Additional Information SSN: Sylvester [place], [place]

## 2018-05-11 NOTE — Progress Notes (Addendum)
PROGRESS NOTE  Mario Warner. AYT:[id number] DOB: 11/20/1916 DOA: 05/09/2018 PCP: Haywood Pao, MD   LOS: 0 days   Brief narrative: Mario Knobel. is a 84 y.o. male with medical history significant of hypertension, hyperlipidemia, diet-controlled diabetes, gout, SDH, atrial flutter not on anticoagulants, prostate cancer, CAD, pacemaker placement due to third-degree AV block, peptic ulcer disease, chronic back pain,presented to the hospital  with altered mental status, fall and sustained back pain.Per report, neighbor heard pt fall and the patient was found to be confused. Per report, pt is normally alert and oriented X4.    In the ED,  pt was found to have WBC 7.8, negative urinalysis, CK 81, lactic acid 3.36, INR 1.05, negative troponin, potassium 6.0, worsening renal function, temperature 99.8, oxygen saturation 96% on room air. Chest x-ray showed COPD, without infiltration.  CT of head was negative for acute intracranial abnormalities.  CT of C-spine showed degenerative disc disease.  CT of L-spine showed progressive metastasized bone disease and degenerative disc disease, no acute bony fracture.  X-ray of pelvis was negative for bony fracture, but showed scelerotic bony disease, unable to rule out mets.    Assessment/Plan:  Principal Problem:   Acute metabolic encephalopathy Active Problems:   Type II diabetes mellitus with renal manifestations (HCC)   HLD (hyperlipidemia)   Essential hypertension   DISC DISEASE, LUMBAR   BPH (benign prostatic hyperplasia)   Atrial flutter (HCC)   Fall   Acute renal failure superimposed on stage 3 chronic kidney disease (HCC)   Hyperkalemia   Lactic acid acidosis   Prostate cancer metastatic to bone (HCC)   Dehydration  Acute metabolic encephalopathy -lactic was elevated on admission but no signs of fever or leukocytosis or sepsis.  Likely encephalopathy was multifactorial from dehydration, worsening renal function, electrolyte  abnormalities.  improved with IV fluid hydration.  Status post fall -No acute bony fracture on images.  CT head is negative for acute intracranial abnormalities.  CT of C-spine showed degenerative disc disease.  Physical therapy/occupational therapy and social workers on board.  Physical therapy and occupational therapy recommend skilled nursing facility placement for rehab.  Dehydration,lactic acidosis.  Continue on IV fluids.  Lactic acidosis has normalized.  No evidence of sepsis.  Blood cultures negative so far. procalcitonin 0.22. urinalysis is negative.  Off IV fluids.  Encourage oral intake.  Hyperkalemia.  Resolved.Acute kidney injury on chronic kidney disease stage III.  Baseline creatinine between 1.4-1.5.  Creatinine of 1.7 today  Diet controled type II diabetes mellitus with chronic kidney disease.  Last A1c 6.4 on 12/24/11, . continue on sliding scale insulin in the hospital.  Accu-Cheks, diabetic diet.  hyperlipidemia: Continue on statins.  Essential hypertension: Continue on amlodipine, Coreg, hydralazine as needed.  Monitor  Back pain and degenerative joint disease on the lumbar spine.  Continue supportive care, percocet and tylenol prn  Hx of Atrial flutter ([place]): CHA2DS2-VASc Score is 5, and was not considered anticoagulation as per the cardiology from outpatient. Continue Coreg.  Prostate cancer metastatic to bone and BPH (benign prostatic hyperplasia): on Proscar.  Supportive care.  VTE Prophylaxis: heparin   Code Status:  Full code  Family Communication: None at bedside   Disposition Plan: SNF 2 to 3 days, awaiting placement.  Unable to reach the patient's contact (granddaughter) on the phone today.   Consultants:  None  Procedures:  None  Antibiotics:  None  Subjective: Patient denies any interval symptoms.  Patient denies any nausea vomiting fever chills or  rigor.  Denies back pain.  Objective: Vitals:   05/11/18 0422 05/11/18 0753  BP: (!)  115/52 136/71  Pulse: (!) 59 60  Resp: 16 16  Temp: 98 F (36.7 C) 98.4 F (36.9 C)  SpO2: 100% 100%    Intake/Output Summary (Last 24 hours) at 05/11/2018 1145 Last data filed at 05/10/2018 2130 Gross per 24 hour  Intake 120 ml  Output -  Net 120 ml   There were no vitals filed for this visit. There is no height or weight on file to calculate BMI.  Physical Examination: General exam: Appears calm and comfortable ,Not in distress HEENT:PERRL,Oral mucosa moist Respiratory system: Bilateral equal air entry, normal vesicular breath sounds, no wheezes or crackles  Cardiovascular system: S1 & S2 heard, RRR.  Gastrointestinal system: Abdomen is nondistended, soft and nontender. No organomegaly or masses felt. Normal bowel sounds heard. Central nervous system: Alert and oriented. No focal neurological deficits. Extremities: No edema, no clubbing ,no cyanosis, distal peripheral pulses palpable. Skin: No rashes, lesions or ulcers,no icterus ,no pallor MSK: Normal muscle bulk,tone ,power  Data Review: I have personally reviewed the following laboratory data and studies,  CBC: Recent Labs  Lab 05/09/18 1605 05/10/18 0508 05/11/18 0240  WBC 7.8 8.4 5.2  HGB 12.0* 9.6* 8.7*  HCT 36.9* 29.5* 26.9*  MCV 88.3 88.1 89.1  PLT 146* 128* 967*   Basic Metabolic Panel: Recent Labs  Lab 05/09/18 1605 05/10/18 0508 05/11/18 0240  NA 134* 140 137  K 6.0* 5.0 3.8  CL 103 113* 109  CO2 23 22 23   GLUCOSE 205* 147* 98  BUN 42* 34* 28*  CREATININE 2.23* 1.79* 1.78*  CALCIUM 9.5 8.5* 8.2*  MG  --   --  1.8   Liver Function Tests: Recent Labs  Lab 05/09/18 1605  AST 26  ALT 13  ALKPHOS 79  BILITOT 0.4  PROT 7.2  ALBUMIN 3.6   Recent Labs  Lab 05/09/18 1605  LIPASE 29   No results for input(s): AMMONIA in the last 168 hours. Cardiac Enzymes: Recent Labs  Lab 05/09/18 1605  CKTOTAL 89   BNP (last 3 results) No results for input(s): BNP in the last 8760  hours.  ProBNP (last 3 results) No results for input(s): PROBNP in the last 8760 hours.  CBG: Recent Labs  Lab 05/10/18 0026 05/10/18 1022 05/10/18 1618 05/10/18 2213 05/11/18 0630  GLUCAP 166* 117* 139* 93 76   Recent Results (from the past 240 hour(s))  Urine Culture     Status: None   Collection Time: 05/09/18  7:38 PM  Result Value Ref Range Status   Specimen Description URINE, RANDOM  Final   Special Requests NONE  Final   Culture   Final    NO GROWTH Performed at [facility], [place] [address], [place], [place] [address]    Report Status 05/10/2018 FINAL  Final  Culture, blood (Routine X 2) w Reflex to ID Panel     Status: None (Preliminary result)   Collection Time: 05/09/18  8:50 PM  Result Value Ref Range Status   Specimen Description BLOOD RIGHT ANTECUBITAL  Final   Special Requests   Final    BOTTLES DRAWN AEROBIC AND ANAEROBIC Blood Culture adequate volume   Culture   Final    NO GROWTH 2 DAYS Performed at [facility], [place] [address] [place], [place] [address]    Report Status PENDING  Incomplete  Culture, blood (Routine X 2) w  Reflex to ID Panel     Status: None (Preliminary result)   Collection Time: 05/09/18  8:50 PM  Result Value Ref Range Status   Specimen Description BLOOD RIGHT HAND  Final   Special Requests   Final    BOTTLES DRAWN AEROBIC AND ANAEROBIC Blood Culture results may not be optimal due to an inadequate volume of blood received in culture bottles   Culture   Final    NO GROWTH 2 DAYS Performed at [facility], [place] [address], [place], [place] [address]    Report Status PENDING  Incomplete  MRSA PCR Screening     Status: None   Collection Time: 05/10/18 12:43 AM  Result Value Ref Range Status   MRSA by PCR NEGATIVE NEGATIVE Final    Comment:        The GeneXpert MRSA Assay (FDA approved for NASAL specimens only), is one component of a comprehensive MRSA colonization surveillance program. It is not intended to  diagnose MRSA infection nor to guide or monitor treatment for MRSA infections. Performed at [facility], [place] [address] [place],  [address]      Studies: Ct Head Wo Contrast  Result Date: 05/09/2018 CLINICAL DATA:  84 year old male post fall.  Initial encounter. EXAM: CT HEAD WITHOUT CONTRAST CT CERVICAL SPINE WITHOUT CONTRAST TECHNIQUE: Multidetector CT imaging of the head and cervical spine was performed following the standard protocol without intravenous contrast. Multiplanar CT image reconstructions of the cervical spine were also generated. COMPARISON:  12/26/2011 head CT.  No comparison cervical spine CT. FINDINGS: CT HEAD FINDINGS Brain: No intracranial hemorrhage or CT evidence of large acute infarct. Chronic microvascular changes. Global atrophy. No intracranial mass lesion noted on this unenhanced exam. Vascular: Vascular calcifications Skull: No skull fracture Sinuses/Orbits: Post lens replacement. No acute orbital abnormality. Minimal mucosal thickening left maxillary sinus. Other: Mastoid air cells and middle ear cavities are clear. CT CERVICAL SPINE FINDINGS Alignment: Mild curvature cervical spine. Slight retrolisthesis C6 and anterolisthesis C7 most likely secondary to facet degenerative changes. Skull base and vertebrae: No cervical spine fracture. Soft tissues: No abnormal prevertebral soft tissue swelling. Disc levels: Multilevel cervical spondylotic changes with various degrees of spinal stenosis and foraminal narrowing. Spinal stenosis most notable C3-4, C5-6 and C6-7. Upper chest: No worrisome abnormality. Other: No worrisome abnormality. IMPRESSION: Head CT: 1. No skull fracture or intracranial hemorrhage. 2. Chronic microvascular changes and atrophy. Cervical spine CT: 1. Mild curvature cervical spine. Slight retrolisthesis C6 and anterolisthesis C7 most likely secondary to facet degenerative changes. 2. No cervical spine fracture or abnormal prevertebral soft  tissue swelling noted. 3. Multilevel cervical spondylotic changes with various degrees of spinal stenosis and foraminal narrowing. Spinal stenosis most notable C3-4, C5-6 and C6-7. Electronically Signed   By: Genia Del M.D.   On: 05/09/2018 18:24   Ct Cervical Spine Wo Contrast  Result Date: 05/09/2018 CLINICAL DATA:  84 year old male post fall.  Initial encounter. EXAM: CT HEAD WITHOUT CONTRAST CT CERVICAL SPINE WITHOUT CONTRAST TECHNIQUE: Multidetector CT imaging of the head and cervical spine was performed following the standard protocol without intravenous contrast. Multiplanar CT image reconstructions of the cervical spine were also generated. COMPARISON:  12/26/2011 head CT.  No comparison cervical spine CT. FINDINGS: CT HEAD FINDINGS Brain: No intracranial hemorrhage or CT evidence of large acute infarct. Chronic microvascular changes. Global atrophy. No intracranial mass lesion noted on this unenhanced exam. Vascular: Vascular calcifications Skull: No skull fracture Sinuses/Orbits: Post lens replacement. No acute  orbital abnormality. Minimal mucosal thickening left maxillary sinus. Other: Mastoid air cells and middle ear cavities are clear. CT CERVICAL SPINE FINDINGS Alignment: Mild curvature cervical spine. Slight retrolisthesis C6 and anterolisthesis C7 most likely secondary to facet degenerative changes. Skull base and vertebrae: No cervical spine fracture. Soft tissues: No abnormal prevertebral soft tissue swelling. Disc levels: Multilevel cervical spondylotic changes with various degrees of spinal stenosis and foraminal narrowing. Spinal stenosis most notable C3-4, C5-6 and C6-7. Upper chest: No worrisome abnormality. Other: No worrisome abnormality. IMPRESSION: Head CT: 1. No skull fracture or intracranial hemorrhage. 2. Chronic microvascular changes and atrophy. Cervical spine CT: 1. Mild curvature cervical spine. Slight retrolisthesis C6 and anterolisthesis C7 most likely secondary to facet  degenerative changes. 2. No cervical spine fracture or abnormal prevertebral soft tissue swelling noted. 3. Multilevel cervical spondylotic changes with various degrees of spinal stenosis and foraminal narrowing. Spinal stenosis most notable C3-4, C5-6 and C6-7. Electronically Signed   By: Genia Del M.D.   On: 05/09/2018 18:24   Ct Lumbar Spine Wo Contrast  Result Date: 05/09/2018 CLINICAL DATA:  Fall today.  Back pain.  History of prostate cancer. EXAM: CT LUMBAR SPINE WITHOUT CONTRAST TECHNIQUE: Multidetector CT imaging of the lumbar spine was performed without intravenous contrast administration. Multiplanar CT image reconstructions were also generated. COMPARISON:  CT abdomen pelvis 01/27/2018 FINDINGS: Segmentation: Normal segmentation. Small ribs at T12.  S1 is partially sacralized. Alignment: 8 mm anterolisthesis L3-4.  3 mm retrolisthesis L4-5. Vertebrae: 2.3 cm sclerotic lesion L1 vertebral body on the left is increased in size since the prior CT abdomen pelvis and is consistent with metastatic disease. Sclerotic lesion right medial iliac bone incompletely evaluated but was present previously. Negative for lumbar fracture. Paraspinal and other soft tissues: Atherosclerotic aorta and iliacs without aneurysm. No retroperitoneal adenopathy. Disc levels: T11-12: Schmorl's node superior endplate of L24. Mild disc and facet degeneration without significant stenosis T12-L1: Broad-based disc protrusion. Bilateral facet degeneration. Moderate subarticular and foraminal stenosis bilaterally. Spinal canal adequate in size. L1-2: Moderate disc bulging. Moderate facet hypertrophy. Mild spinal stenosis. Mild subarticular stenosis bilaterally L2-3: Diffuse bulging of the disc. Moderate facet hypertrophy. Moderate spinal stenosis. Moderate subarticular and foraminal stenosis bilaterally L3-4: 8 mm anterolisthesis. Diffuse disc bulging. Severe facet hypertrophy. Probable laminectomy at this level in the past.  Severe spinal stenosis. Severe subarticular and foraminal stenosis bilaterally with impingement of the L3 and L4 nerve roots bilaterally. L4-5: 3 mm retrolisthesis. Diffuse bulging of the disc. Moderate facet hypertrophy. Moderate spinal stenosis. Moderate subarticular stenosis bilaterally L5-S1: L5 transverse processes articulate with the sacrum. No significant degenerative change. IMPRESSION: 1. Sclerotic lesion L1 vertebral body with progression since CT of 01/27/2018 compatible with progressive prostate cancer metastasis. Sclerotic lesion right medial iliac bone also consistent with metastatic disease although incompletely evaluated on the study. 2. Extensive multilevel lumbar degenerative change as above. Multilevel spinal and subarticular stenosis, most severe at L2-3, L3-4, L4-5. Electronically Signed   By: Franchot Gallo M.D.   On: 05/09/2018 18:28   Dg Pelvis Portable  Result Date: 05/09/2018 CLINICAL DATA:  84 year old male with a history of fall EXAM: PORTABLE PELVIS 1-2 VIEWS COMPARISON:  CT 01/27/2018 FINDINGS: Osteopenia. Bony pelvic ring intact with no acute fracture line identified. Bilateral hips projects normally over the acetabula. Unremarkable proximal femurs. Pelvic phleboliths. Degenerative changes of the spine. Sclerotic focus of the right iliac bone again demonstrated, present on the prior CT study IMPRESSION: Negative for acute bony abnormality. Sclerotic focus of the right iliac  bone again demonstrated which was present on prior CT study. Again, metastases not excluded and correlation with a history of malignancy recommended. Electronically Signed   By: Corrie Mckusick D.O.   On: 05/09/2018 17:10   Dg Chest Port 1 View  Result Date: 05/09/2018 CLINICAL DATA:  Possible fall today. Known fall several weeks ago. No current chest pain or shortness of breath. History of hypertension, coronary artery disease, complete heart block. EXAM: PORTABLE CHEST 1 VIEW COMPARISON:  Portable chest  x-ray of January 27, 2018 FINDINGS: The lungs are mildly hyperinflated. There is no focal infiltrate, pleural effusion, or pneumothorax. The heart and pulmonary vascularity are normal. The mediastinum is normal in width. There is calcification in the wall of the aortic arch. The ICD is in stable position. The observed bony thorax exhibits no acute abnormality. IMPRESSION: COPD. No pneumonia, CHF, nor other acute cardiopulmonary abnormality. No evidence of acute post traumatic injury of the thorax. Electronically Signed   By: David  Martinique M.D.   On: 05/09/2018 17:06    Scheduled Meds: . amLODipine  5 mg Oral Daily  . atorvastatin  20 mg Oral Daily  . brimonidine  1 drop Both Eyes BID   And  . timolol  1 drop Both Eyes BID  . carvedilol  12.5 mg Oral BID WC  . finasteride  5 mg Oral Daily  . heparin  5,000 Units Subcutaneous Q8H  . insulin aspart  0-5 Units Subcutaneous QHS  . insulin aspart  0-9 Units Subcutaneous TID WC  . polyethylene glycol  17 g Oral Daily   Continuous Infusions: . sodium chloride      Time spent: 25 minutes. More than 50% of that time was spent in counseling and/or coordination of care.  Alailah Safley  [facility] Pager [phone]  If 7PM-7AM, please contact night-coverage at [email or web address], password [facility] 05/11/2018, 11:45 AM

## 2018-05-12 DIAGNOSIS — E7800 Pure hypercholesterolemia, unspecified: Secondary | ICD-10-CM | POA: Diagnosis present

## 2018-05-12 DIAGNOSIS — N400 Enlarged prostate without lower urinary tract symptoms: Secondary | ICD-10-CM | POA: Diagnosis present

## 2018-05-12 DIAGNOSIS — I10 Essential (primary) hypertension: Secondary | ICD-10-CM | POA: Diagnosis not present

## 2018-05-12 DIAGNOSIS — E1122 Type 2 diabetes mellitus with diabetic chronic kidney disease: Secondary | ICD-10-CM | POA: Diagnosis present

## 2018-05-12 DIAGNOSIS — E875 Hyperkalemia: Secondary | ICD-10-CM | POA: Diagnosis present

## 2018-05-12 DIAGNOSIS — Z79899 Other long term (current) drug therapy: Secondary | ICD-10-CM | POA: Diagnosis not present

## 2018-05-12 DIAGNOSIS — N183 Chronic kidney disease, stage 3 (moderate): Secondary | ICD-10-CM | POA: Diagnosis present

## 2018-05-12 DIAGNOSIS — I4892 Unspecified atrial flutter: Secondary | ICD-10-CM | POA: Diagnosis not present

## 2018-05-12 DIAGNOSIS — I129 Hypertensive chronic kidney disease with stage 1 through stage 4 chronic kidney disease, or unspecified chronic kidney disease: Secondary | ICD-10-CM | POA: Diagnosis present

## 2018-05-12 DIAGNOSIS — Z8546 Personal history of malignant neoplasm of prostate: Secondary | ICD-10-CM | POA: Diagnosis not present

## 2018-05-12 DIAGNOSIS — J449 Chronic obstructive pulmonary disease, unspecified: Secondary | ICD-10-CM | POA: Diagnosis present

## 2018-05-12 DIAGNOSIS — G9341 Metabolic encephalopathy: Secondary | ICD-10-CM | POA: Diagnosis present

## 2018-05-12 DIAGNOSIS — E860 Dehydration: Secondary | ICD-10-CM | POA: Diagnosis present

## 2018-05-12 DIAGNOSIS — Z87891 Personal history of nicotine dependence: Secondary | ICD-10-CM | POA: Diagnosis not present

## 2018-05-12 DIAGNOSIS — R4182 Altered mental status, unspecified: Secondary | ICD-10-CM | POA: Diagnosis present

## 2018-05-12 DIAGNOSIS — I2510 Atherosclerotic heart disease of native coronary artery without angina pectoris: Secondary | ICD-10-CM | POA: Diagnosis present

## 2018-05-12 DIAGNOSIS — E872 Acidosis: Secondary | ICD-10-CM | POA: Diagnosis present

## 2018-05-12 DIAGNOSIS — E785 Hyperlipidemia, unspecified: Secondary | ICD-10-CM | POA: Diagnosis present

## 2018-05-12 DIAGNOSIS — C7951 Secondary malignant neoplasm of bone: Secondary | ICD-10-CM | POA: Diagnosis present

## 2018-05-12 DIAGNOSIS — M5136 Other intervertebral disc degeneration, lumbar region: Secondary | ICD-10-CM | POA: Diagnosis present

## 2018-05-12 DIAGNOSIS — Z950 Presence of cardiac pacemaker: Secondary | ICD-10-CM | POA: Diagnosis not present

## 2018-05-12 DIAGNOSIS — N179 Acute kidney failure, unspecified: Secondary | ICD-10-CM | POA: Diagnosis present

## 2018-05-12 DIAGNOSIS — Z833 Family history of diabetes mellitus: Secondary | ICD-10-CM | POA: Diagnosis not present

## 2018-05-12 LAB — GLUCOSE, CAPILLARY
Glucose-Capillary: 104 mg/dL — ABNORMAL HIGH (ref 70–99)
Glucose-Capillary: 120 mg/dL — ABNORMAL HIGH (ref 70–99)
Glucose-Capillary: 117 mg/dL — ABNORMAL HIGH (ref 70–99)
Glucose-Capillary: 139 mg/dL — ABNORMAL HIGH (ref 70–99)

## 2018-05-12 LAB — BASIC METABOLIC PANEL
Anion gap: 2 — ABNORMAL LOW (ref 5–15)
BUN: 24 mg/dL — ABNORMAL HIGH (ref 8–23)
CALCIUM: 8.5 mg/dL — AB (ref 8.9–10.3)
CO2: 25 mmol/L (ref 22–32)
Chloride: 111 mmol/L (ref 98–111)
Creatinine, Ser: 1.8 mg/dL — ABNORMAL HIGH (ref 0.61–1.24)
GFR calc Af Amer: 35 mL/min — ABNORMAL LOW (ref >60)
GFR calc non Af Amer: 30 mL/min — ABNORMAL LOW (ref >60)
Glucose, Bld: 110 mg/dL — ABNORMAL HIGH (ref 70–99)
Potassium: 4.4 mmol/L (ref 3.5–5.1)
Sodium: 138 mmol/L (ref 135–145)

## 2018-05-12 LAB — CBC
HCT: 29 % — ABNORMAL LOW (ref 39.0–52.0)
Hemoglobin: 9.4 g/dL — ABNORMAL LOW (ref 13.0–17.0)
MCH: 28.6 pg (ref 26.0–34.0)
MCHC: 32.4 g/dL (ref 30.0–36.0)
MCV: 88.1 fL (ref 80.0–100.0)
Platelets: 138 10*3/uL — ABNORMAL LOW (ref 150–400)
RBC: 3.29 MIL/uL — AB (ref 4.22–5.81)
RDW: 12.6 % (ref 11.5–15.5)
WBC: 4.3 10*3/uL (ref 4.0–10.5)
nRBC: 0 % (ref 0.0–0.2)

## 2018-05-12 NOTE — Progress Notes (Signed)
PROGRESS NOTE  Mario Warner. VQM:[id number] DOB: Jul 31, 1916 DOA: 05/09/2018 PCP: Haywood Pao, MD   LOS: 0 days   Brief narrative: Mario Warner. is a 84 y.o. male with medical history significant of hypertension, hyperlipidemia, diet-controlled diabetes, gout, SDH, atrial flutter not on anticoagulants, prostate cancer, CAD, pacemaker placement due to third-degree AV block, peptic ulcer disease, chronic back pain,presented to the hospital  with altered mental status, fall and sustained back pain.Per report, neighbor heard pt fall and the patient was found to be confused. Per report, pt is normally alert and oriented X4.    In the ED,  pt was found to have WBC 7.8, negative urinalysis, CK 81, lactic acid 3.36, INR 1.05, negative troponin, potassium 6.0, worsening renal function, temperature 99.8, oxygen saturation 96% on room air. Chest x-ray showed COPD, without infiltration.  CT of head was negative for acute intracranial abnormalities.  CT of C-spine showed degenerative disc disease.  CT of L-spine showed progressive metastasized bone disease and degenerative disc disease, no acute bony fracture.  X-ray of pelvis was negative for bony fracture, but showed scelerotic bony disease, unable to rule out mets.    Patient's mentation improved during hospitalization. He is currently awaiting for skilled nursing facility placement.  Assessment/Plan:  Principal Problem:   Acute metabolic encephalopathy Active Problems:   Type II diabetes mellitus with renal manifestations (HCC)   HLD (hyperlipidemia)   Essential hypertension   DISC DISEASE, LUMBAR   BPH (benign prostatic hyperplasia)   Atrial flutter (HCC)   Fall   Acute renal failure superimposed on stage 3 chronic kidney disease (HCC)   Hyperkalemia   Lactic acid acidosis   Prostate cancer metastatic to bone (HCC)   Dehydration  Acute metabolic encephalopathy -resolved.  Lactic was elevated on admission but no signs of fever or  leukocytosis or sepsis.  Likely encephalopathy was multifactorial from dehydration, worsening renal function, electrolyte abnormalities.  Status post fall -No acute bony fracture on images.  CT head is negative for acute intracranial abnormalities.  CT of C-spine showed degenerative disc disease.   Physical and occupational therapy recommend skilled nursing facility placement for rehab.  Ending placement at this time  Dehydration,lactic acidosis.  Keep the IV fluids.  Lactic acidosis has normalized.  No evidence of sepsis.  Blood cultures negative so far. procalcitonin 0.22. urinalysis is negative.    Encourage oral intake.  Hyperkalemia.  Resolved  .Acute kidney injury on chronic kidney disease stage III.  Baseline creatinine between 1.4-1.5.  Creatinine of 1.8 today.  Will encourage oral intake.  Avoid nephrotoxic medication. Will follow up with BMP.  Diet controled type II diabetes mellitus with chronic kidney disease.  Last A1c 6.4 on 12/24/11, . continue on sliding scale insulin in the hospital.  Accu-Cheks, diabetic diet.  hyperlipidemia: Continue on statins.  Essential hypertension: Continue on amlodipine, Coreg, hydralazine as needed.  Monitor  Back pain and degenerative joint disease on the lumbar spine.  Continue supportive care, percocet and tylenol prn  Hx of Atrial flutter ([place]): CHA2DS2-VASc Score is 5, and was not considered anticoagulation as per the cardiology from outpatient. Continue Coreg.  Prostate cancer metastatic to bone and BPH (benign prostatic hyperplasia): on Proscar.  Supportive care.  VTE Prophylaxis: heparin   Code Status:  Full code  Family Communication: unable to reach the patient's granddaughter on the phone and could not leave a voice message.  Disposition Plan: SNF 1-2 days, awaiting placement.     Consultants:  None  Procedures:  None  Antibiotics:  None  Subjective: Patient states that he feels much better today is alert awake  communicative.  Denies any nausea vomiting dizziness lightheadedness Objective: Vitals:   05/12/18 0410 05/12/18 0735  BP: (!) 146/65 123/67  Pulse: 61 (!) 59  Resp: 18 19  Temp: 97.8 F (36.6 C) 99.6 F (37.6 C)  SpO2: 100% 100%    Intake/Output Summary (Last 24 hours) at 05/12/2018 1102 Last data filed at 05/11/2018 2200 Gross per 24 hour  Intake 240 ml  Output -  Net 240 ml   There is no height or weight on file to calculate BMI.   Physical Examination: General exam: Appears calm and comfortable ,Not in distress HEENT:PERRL,Oral mucosa moist Respiratory system: Bilateral equal air entry, normal vesicular breath sounds, no wheezes or crackles  Cardiovascular system: S1 & S2 heard, RRR.  Gastrointestinal system: Abdomen is nondistended, soft and nontender. No organomegaly or masses felt. Normal bowel sounds heard. Central nervous system: Alert and oriented. No focal neurological deficits. Extremities: No edema, no clubbing ,no cyanosis, distal peripheral pulses palpable. Skin: No rashes, lesions or ulcers,no icterus ,no pallor MSK: Normal muscle bulk,tone ,power   Data Review: I have personally reviewed the following laboratory data and studies,  CBC: Recent Labs  Lab 05/09/18 1605 05/10/18 0508 05/11/18 0240 05/12/18 0549  WBC 7.8 8.4 5.2 4.3  HGB 12.0* 9.6* 8.7* 9.4*  HCT 36.9* 29.5* 26.9* 29.0*  MCV 88.3 88.1 89.1 88.1  PLT 146* 128* 117* 478*   Basic Metabolic Panel: Recent Labs  Lab 05/09/18 1605 05/10/18 0508 05/11/18 0240 05/12/18 0549  NA 134* 140 137 138  K 6.0* 5.0 3.8 4.4  CL 103 113* 109 111  CO2 23 22 23 25   GLUCOSE 205* 147* 98 110*  BUN 42* 34* 28* 24*  CREATININE 2.23* 1.79* 1.78* 1.80*  CALCIUM 9.5 8.5* 8.2* 8.5*  MG  --   --  1.8  --    Liver Function Tests: Recent Labs  Lab 05/09/18 1605  AST 26  ALT 13  ALKPHOS 79  BILITOT 0.4  PROT 7.2  ALBUMIN 3.6   Recent Labs  Lab 05/09/18 1605  LIPASE 29   No results for  input(s): AMMONIA in the last 168 hours. Cardiac Enzymes: Recent Labs  Lab 05/09/18 1605  CKTOTAL 89   BNP (last 3 results) No results for input(s): BNP in the last 8760 hours.  ProBNP (last 3 results) No results for input(s): PROBNP in the last 8760 hours.  CBG: Recent Labs  Lab 05/11/18 0630 05/11/18 1122 05/11/18 1607 05/11/18 2138 05/12/18 0649  GLUCAP 76 101* 148* 159* 104*   Recent Results (from the past 240 hour(s))  Urine Culture     Status: None   Collection Time: 05/09/18  7:38 PM  Result Value Ref Range Status   Specimen Description URINE, RANDOM  Final   Special Requests NONE  Final   Culture   Final    NO GROWTH Performed at [facility], [place] [address], [place], [place] [address]    Report Status 05/10/2018 FINAL  Final  Culture, blood (Routine X 2) w Reflex to ID Panel     Status: None (Preliminary result)   Collection Time: 05/09/18  8:50 PM  Result Value Ref Range Status   Specimen Description BLOOD RIGHT ANTECUBITAL  Final   Special Requests   Final    BOTTLES DRAWN AEROBIC AND ANAEROBIC Blood Culture adequate volume   Culture   Final  NO GROWTH 3 DAYS Performed at [facility], [place] [address] [place], [place] [address]    Report Status PENDING  Incomplete  Culture, blood (Routine X 2) w Reflex to ID Panel     Status: None (Preliminary result)   Collection Time: 05/09/18  8:50 PM  Result Value Ref Range Status   Specimen Description BLOOD RIGHT HAND  Final   Special Requests   Final    BOTTLES DRAWN AEROBIC AND ANAEROBIC Blood Culture results may not be optimal due to an inadequate volume of blood received in culture bottles   Culture   Final    NO GROWTH 3 DAYS Performed at [facility], [place] [address] [place], [place] [address]    Report Status PENDING  Incomplete  MRSA PCR Screening     Status: None   Collection Time: 05/10/18 12:43 AM  Result Value Ref Range Status   MRSA by PCR NEGATIVE NEGATIVE Final    Comment:         The GeneXpert MRSA Assay (FDA approved for NASAL specimens only), is one component of a comprehensive MRSA colonization surveillance program. It is not intended to diagnose MRSA infection nor to guide or monitor treatment for MRSA infections. Performed at [facility], [place] [address], [place], [place] [address]      Studies: No results found.  Scheduled Meds: . amLODipine  5 mg Oral Daily  . atorvastatin  20 mg Oral Daily  . brimonidine  1 drop Both Eyes BID   And  . timolol  1 drop Both Eyes BID  . carvedilol  12.5 mg Oral BID WC  . finasteride  5 mg Oral Daily  . heparin  5,000 Units Subcutaneous Q8H  . insulin aspart  0-5 Units Subcutaneous QHS  . insulin aspart  0-9 Units Subcutaneous TID WC  . polyethylene glycol  17 g Oral Daily   Continuous Infusions: . sodium chloride      Time spent: 25 minutes. More than 50% of that time was spent in counseling and/or coordination of care.  Yassmin Binegar  [facility] Pager [phone]  If 7PM-7AM, please contact night-coverage at [email or web address], password [facility] 05/12/2018, 11:02 AM

## 2018-05-12 NOTE — Progress Notes (Signed)
Physical Therapy Treatment Patient Details Name: Mario Warner. MRN: [id number] DOB: December 27, 1916 Today's Date: 05/12/2018    History of Present Illness 84 y.o. male with medical history significant of hypertension, hyperlipidemia, diet-controlled diabetes, gout, SDH, atrial flutter not on anticoagulants, prostate cancer, CAD, pacemaker placement due to third-degree AV block, peptic ulcer disease, chronic back pain, who presents with altered mental status, fall and back pain.    PT Comments    Patient's tolerance to treatment today was good.  Pt was in bed with no visitors present upon PT arrival.  Pt was disoriented to time and place but was able to state he was in a hospital.  Pt was pleasant and agreeable to therapy.  Pt ambulated in hallway with RW requiring one seated rest break due to L knee pain.  O2 sats were measured at 91-92% on room air.  Pt educated on pursed lip breathing.  Pt would continue to benefit from acute care PT to address strength and mobility deficits.  Pt continues to be a good candidate for SNF placement based on current functional status.   Follow Up Recommendations  SNF     Equipment Recommendations  None recommended by PT    Recommendations for Other Services       Precautions / Restrictions Precautions Precautions: Fall Restrictions Weight Bearing Restrictions: No    Mobility  Bed Mobility Overal bed mobility: Needs Assistance Bed Mobility: Supine to Sit     Supine to sit: Min assist     General bed mobility comments: pt continues to require increased time and min A for trunk elevation.  Min A for moving B LE over EOB and squaring up hips.  Transfers Overall transfer level: Needs assistance Equipment used: Rolling walker (2 wheeled) Transfers: Sit to/from Stand Sit to Stand: Mod assist         General transfer comment: pt required min VC for safe hand placement on bed surface prior to standing.  pt required mod A to boost into standing  and increased time to elevate trunk.  VC for hand placement on RW once standing.  Ambulation/Gait Ambulation/Gait assistance: Mod assist Gait Distance (Feet): 40 Feet(40 ft during 2nd trial) Assistive device: Rolling walker (2 wheeled) Gait Pattern/deviations: Step-through pattern;Decreased step length - right;Decreased step length - left;Decreased stride length;Antalgic;Drifts right/left;Narrow base of support;Trunk flexed Gait velocity: decreased   General Gait Details: Pt required VC for maintaining path of RW and mod A for steadying.  Pt required VC to increase BOS.  Pt required repeated VC to remain within RW during ambulation.  Pt required one seated rest break due to complaints of fatigue and L knee pain.  VC provided for reaching back for armrests prior to sitting.  Pt was able to ambulate back to room during second gait trial.   Stairs             Wheelchair Mobility    Modified Rankin (Stroke Patients Only)       Balance Overall balance assessment: Needs assistance Sitting-balance support: Feet supported Sitting balance-Leahy Scale: Good     Standing balance support: Bilateral upper extremity supported;During functional activity Standing balance-Leahy Scale: Poor                              Cognition Arousal/Alertness: Awake/alert Behavior During Therapy: WFL for tasks assessed/performed Overall Cognitive Status: No family/caregiver present to determine baseline cognitive functioning  Exercises General Exercises - Lower Extremity Long Arc Quad: AROM;Both;10 reps;Seated    General Comments        Pertinent Vitals/Pain Pain Assessment: Faces Faces Pain Scale: Hurts a little bit Pain Location: low back and L knee Pain Descriptors / Indicators: Discomfort;Stabbing;Aching;Grimacing Pain Intervention(s): Limited activity within patient's tolerance;Monitored during session;Repositioned     Home Living                      Prior Function            PT Goals (current goals can now be found in the care plan section) Acute Rehab PT Goals Patient Stated Goal: none stated PT Goal Formulation: Patient unable to participate in goal setting Time For Goal Achievement: 05/24/18 Potential to Achieve Goals: Good Progress towards PT goals: Progressing toward goals    Frequency    Min 2X/week      PT Plan      Co-evaluation              AM-PAC PT "6 Clicks" Mobility   Outcome Measure  Help needed turning from your back to your side while in a flat bed without using bedrails?: A Little Help needed moving from lying on your back to sitting on the side of a flat bed without using bedrails?: A Little Help needed moving to and from a bed to a chair (including a wheelchair)?: A Little Help needed standing up from a chair using your arms (e.g., wheelchair or bedside chair)?: A Little Help needed to walk in hospital room?: A Lot Help needed climbing 3-5 steps with a railing? : A Lot 6 Click Score: 16    End of Session Equipment Utilized During Treatment: Gait belt Activity Tolerance: Patient tolerated treatment well Patient left: in chair;with call bell/phone within reach;with chair alarm set Nurse Communication: Mobility status PT Visit Diagnosis: Unsteadiness on feet (R26.81);History of falling (Z91.81);Difficulty in walking, not elsewhere classified (R26.2);Muscle weakness (generalized) (M62.81)     Time: 6979-4801 PT Time Calculation (min) (ACUTE ONLY): 36 min  Charges:  $Gait Training: 8-22 mins $Therapeutic Activity: 8-22 mins                     [address], SPTA    Kinlie Janice 05/12/2018, 1:03 PM

## 2018-05-12 NOTE — Clinical Social Work Note (Signed)
Clinical Social Work Assessment  Patient Details  Name: Mario Warner. MRN: [id number] Date of Birth: 03/16/1917  Date of referral:  05/12/18               Reason for consult:  Discharge Planning                Permission sought to share information with:  Case Manager, Facility Sport and exercise psychologist, Family Supports Permission granted to share information::  Yes, Verbal Permission Granted  Name::     Museum/gallery curator::  SNFs  Relationship::  daughter  Contact Information:  [phone]  Housing/Transportation Living arrangements for the past 2 months:  [place] of Information:  Patient Patient Interpreter Needed:  None Criminal Activity/Legal Involvement Pertinent to Current Situation/Hospitalization:  No - Comment as needed Significant Relationships:  Adult Children Lives with:  Self Do you feel safe going back to the place where you live?  No Need for family participation in patient care:  Yes (Comment)  Care giving concerns:  CSW received referral for possible SNF placement at time of discharge. Spoke with patient regarding possibility of SNF placement . Patient's family    is currently unable to care for him at their home given patient's current needs and fall risk.  Patient's daughter Mario Warner expressed understanding of PT recommendation and are agreeable to SNF placement at time of discharge. CSW to continue to follow and assist with discharge planning needs.     Social Worker assessment / plan:  Spoke with patient's daughter Mario Warner  concerning possibility of rehab at [facility] before returning home.    Employment status:  Retired Nurse, adult PT Recommendations:  [place] / Referral to community resources:  [place]  Patient/Family's Response to care:  Patient and daughter Mario Warner recognize need for rehab before returning home and are agreeable to a SNF in [place]. They report  preference for [facility]  . CSW explained insurance authorization process. Patient's family reported that they want patient to get stronger to be able to come back home.   Patient/Family's Understanding of and Emotional Response to Diagnosis, Current Treatment, and Prognosis: Patient/family is realistic regarding therapy needs and expressed being hopeful for SNF placement. Patient expressed understanding of CSW role and discharge process as well as medical condition. No questions/concerns about plan or treatment.    Emotional Assessment Appearance:  Appears stated age Attitude/Demeanor/Rapport:  Unable to Assess Affect (typically observed):  Unable to Assess Orientation:  Oriented to Self, Oriented to Place, Oriented to Situation Alcohol / Substance use:    Psych involvement (Current and /or in the community):  No (Comment)  Discharge Needs  Concerns to be addressed:  Discharge Planning Concerns Readmission within the last 30 days:  No Current discharge risk:  Dependent with Mobility Barriers to Discharge:  Continued Medical Work up   [employer], LCSW 05/12/2018, 12:38 PM

## 2018-05-13 LAB — BASIC METABOLIC PANEL
Anion gap: 9 (ref 5–15)
BUN: 24 mg/dL — ABNORMAL HIGH (ref 8–23)
CHLORIDE: 107 mmol/L (ref 98–111)
CO2: 22 mmol/L (ref 22–32)
Calcium: 8.5 mg/dL — ABNORMAL LOW (ref 8.9–10.3)
Creatinine, Ser: 1.57 mg/dL — ABNORMAL HIGH (ref 0.61–1.24)
GFR calc Af Amer: 42 mL/min — ABNORMAL LOW (ref >60)
GFR calc non Af Amer: 36 mL/min — ABNORMAL LOW (ref >60)
Glucose, Bld: 100 mg/dL — ABNORMAL HIGH (ref 70–99)
Potassium: 4.2 mmol/L (ref 3.5–5.1)
Sodium: 138 mmol/L (ref 135–145)

## 2018-05-13 LAB — GLUCOSE, CAPILLARY
Glucose-Capillary: 83 mg/dL (ref 70–99)
Glucose-Capillary: 114 mg/dL — ABNORMAL HIGH (ref 70–99)
Glucose-Capillary: 172 mg/dL — ABNORMAL HIGH (ref 70–99)
Glucose-Capillary: 124 mg/dL — ABNORMAL HIGH (ref 70–99)

## 2018-05-13 LAB — CBC
HCT: 30 % — ABNORMAL LOW (ref 39.0–52.0)
Hemoglobin: 9.6 g/dL — ABNORMAL LOW (ref 13.0–17.0)
MCH: 28.2 pg (ref 26.0–34.0)
MCHC: 32 g/dL (ref 30.0–36.0)
MCV: 88 fL (ref 80.0–100.0)
PLATELETS: 131 10*3/uL — AB (ref 150–400)
RBC: 3.41 MIL/uL — ABNORMAL LOW (ref 4.22–5.81)
RDW: 12.7 % (ref 11.5–15.5)
WBC: 3.9 10*3/uL — ABNORMAL LOW (ref 4.0–10.5)
nRBC: 0 % (ref 0.0–0.2)

## 2018-05-13 NOTE — Progress Notes (Signed)
PROGRESS NOTE    Mario Warner.  BZJ:[id number] DOB: July 30, 1916 DOA: 05/09/2018 PCP: Haywood Pao, MD   Brief Narrative: Patient is a 84 year old male with past medical history of hypertension, hyperlipidemia, diabetes, SDH, atrial flutter not on anticoagulation, prostate cancer, coronary artery disease status post pacemaker placement due to third-degree heart block who presented to the hospital with altered mental status, fall and back pain.  He was found to have lactic acidosis, hyperkalemia and acute kidney injury on presentation.  Overall status improving.  Currently he is waiting for skilled nursing facility bed.  Assessment & Plan:   Principal Problem:   Acute metabolic encephalopathy Active Problems:   Type II diabetes mellitus with renal manifestations (HCC)   HLD (hyperlipidemia)   Essential hypertension   DISC DISEASE, LUMBAR   BPH (benign prostatic hyperplasia)   Atrial flutter (HCC)   Fall   Acute renal failure superimposed on stage 3 chronic kidney disease (HCC)   Hyperkalemia   Lactic acid acidosis   Prostate cancer metastatic to bone (HCC)   Dehydration   Acute metabolic encephalopathy: Improving.  Patient is alert but still not oriented to time.  Hemodynamically stable.  Altered mental status on presentation was multifactorial from dehydration, worsening renal function, electrolyte abnormalities.  Fall: No acute abnormalities on images.  PT/OT recommended skilled nursing facility.  Social worker following.  Waiting for rehab bed.  Dehydration/lactic acidosis: Improved with IV fluids.  No evidence of sepsis.  Blood cultures negative.  Encourage oral intake.  Hyperkalemia: Resolved  Acute kidney injury and CKD stage III: Resolved with IV fluids.  Currently kidney function is on baseline.  His baseline creatinine ranges from 1 1 4-1.5.  Diabetes mellitus: Last hemoglobin A1c 6.4 on 12/24/2011.  Continue sliding scale insulin.  Hyperlipidemia: Continue  statin  Hypertension: Slightly hypertensive this morning.  We will continue to monitor.  Continue current regimen with amlodipine, Coreg, hydralazine as needed.  Back pain/degenerative joint disease: Continue supportive care.  Pain management  Hx ofAtrial flutter:CHA2DS2-VASc Scoreis 5, and was not considered anticoagulation as per the cardiology from outpatient. ContinueCoreg.  Prostate cancer metastatic to boneandBPH: onProscar.  Supportive care.  Patient is stable for discharge to skilled nursing facility as soon as the bed is available.  DVT prophylaxis: heparin [place] Code Status: Full Family Communication: None present at the bedside Disposition Plan: Skilled nursing facility as soon as bed is available   Consultants: None  Procedures:None  Antimicrobials:None  Subjective: Patient seen and examined the bedside this morning.  Remains hemodynamically stable.  Looked comfortable during my evaluation.  Alert and communicative but not oriented to time.  Objective: Vitals:   05/12/18 1940 05/12/18 2340 05/13/18 0409 05/13/18 0744  BP: 133/71 139/66 (!) 164/76 (!) 166/80  Pulse: 62 60 61 (!) 59  Resp: 20 20 18 18   Temp: 98.5 F (36.9 C) 98.3 F (36.8 C) 97.9 F (36.6 C) 98 F (36.7 C)  TempSrc: Oral Oral Oral Oral  SpO2: 98% 100% 100% 100%    Intake/Output Summary (Last 24 hours) at 05/13/2018 0175 Last data filed at 05/12/2018 2242 Gross per 24 hour  Intake 240 ml  Output 300 ml  Net -60 ml   There were no vitals filed for this visit.  Examination:  General exam: Appears calm and comfortable ,Not in distress,average built HEENT:PERRL,Oral mucosa moist, Ear/Nose normal on gross exam Respiratory system: Bilateral equal air entry, normal vesicular breath sounds, no wheezes or crackles  Cardiovascular system: S1 & S2 heard, RRR. No  JVD, murmurs, rubs, gallops or clicks. No pedal edema. Gastrointestinal system: Abdomen is nondistended, soft and nontender. No  organomegaly or masses felt. Normal bowel sounds heard. Central nervous system: Alert , oriented times 1 only . No focal neurological deficits. Extremities: No edema, no clubbing ,no cyanosis, distal peripheral pulses palpable. Skin: No rashes, lesions or ulcers,no icterus ,no pallor MSK: Normal muscle bulk,tone ,power Psychiatry: Judgement and insight appear impaired   Data Reviewed: I have personally reviewed following labs and imaging studies  CBC: Recent Labs  Lab 05/09/18 1605 05/10/18 0508 05/11/18 0240 05/12/18 0549 05/13/18 0532  WBC 7.8 8.4 5.2 4.3 3.9*  HGB 12.0* 9.6* 8.7* 9.4* 9.6*  HCT 36.9* 29.5* 26.9* 29.0* 30.0*  MCV 88.3 88.1 89.1 88.1 88.0  PLT 146* 128* 117* 138* 509*   Basic Metabolic Panel: Recent Labs  Lab 05/09/18 1605 05/10/18 0508 05/11/18 0240 05/12/18 0549 05/13/18 0532  NA 134* 140 137 138 138  K 6.0* 5.0 3.8 4.4 4.2  CL 103 113* 109 111 107  CO2 23 22 23 25 22   GLUCOSE 205* 147* 98 110* 100*  BUN 42* 34* 28* 24* 24*  CREATININE 2.23* 1.79* 1.78* 1.80* 1.57*  CALCIUM 9.5 8.5* 8.2* 8.5* 8.5*  MG  --   --  1.8  --   --    GFR: CrCl cannot be calculated (Unknown ideal weight.). Liver Function Tests: Recent Labs  Lab 05/09/18 1605  AST 26  ALT 13  ALKPHOS 79  BILITOT 0.4  PROT 7.2  ALBUMIN 3.6   Recent Labs  Lab 05/09/18 1605  LIPASE 29   No results for input(s): AMMONIA in the last 168 hours. Coagulation Profile: Recent Labs  Lab 05/09/18 1605  INR 1.05   Cardiac Enzymes: Recent Labs  Lab 05/09/18 1605  CKTOTAL 89   BNP (last 3 results) No results for input(s): PROBNP in the last 8760 hours. HbA1C: No results for input(s): HGBA1C in the last 72 hours. CBG: Recent Labs  Lab 05/12/18 0649 05/12/18 1111 05/12/18 1647 05/12/18 2151 05/13/18 0714  GLUCAP 104* 120* 117* 139* 83   Lipid Profile: No results for input(s): CHOL, HDL, LDLCALC, TRIG, CHOLHDL, LDLDIRECT in the last 72 hours. Thyroid Function  Tests: No results for input(s): TSH, T4TOTAL, FREET4, T3FREE, THYROIDAB in the last 72 hours. Anemia Panel: No results for input(s): VITAMINB12, FOLATE, FERRITIN, TIBC, IRON, RETICCTPCT in the last 72 hours. Sepsis Labs: Recent Labs  Lab 05/09/18 2301 05/10/18 0508 05/10/18 1116 05/10/18 1503  PROCALCITON 0.22  --   --   --   LATICACIDVEN 4.1* 2.4* 2.0* 1.8    Recent Results (from the past 240 hour(s))  Urine Culture     Status: None   Collection Time: 05/09/18  7:38 PM  Result Value Ref Range Status   Specimen Description URINE, RANDOM  Final   Special Requests NONE  Final   Culture   Final    NO GROWTH Performed at [facility], [place] [address], [place], [place] [address]    Report Status 05/10/2018 FINAL  Final  Culture, blood (Routine X 2) w Reflex to ID Panel     Status: None (Preliminary result)   Collection Time: 05/09/18  8:50 PM  Result Value Ref Range Status   Specimen Description BLOOD RIGHT ANTECUBITAL  Final   Special Requests   Final    BOTTLES DRAWN AEROBIC AND ANAEROBIC Blood Culture adequate volume   Culture   Final    NO GROWTH 4 DAYS  Performed at [facility], [place] [address] [place], [place] [address]    Report Status PENDING  Incomplete  Culture, blood (Routine X 2) w Reflex to ID Panel     Status: None (Preliminary result)   Collection Time: 05/09/18  8:50 PM  Result Value Ref Range Status   Specimen Description BLOOD RIGHT HAND  Final   Special Requests   Final    BOTTLES DRAWN AEROBIC AND ANAEROBIC Blood Culture results may not be optimal due to an inadequate volume of blood received in culture bottles   Culture   Final    NO GROWTH 4 DAYS Performed at [facility], [place] [address] [place], [place] [address]    Report Status PENDING  Incomplete  MRSA PCR Screening     Status: None   Collection Time: 05/10/18 12:43 AM  Result Value Ref Range Status   MRSA by PCR NEGATIVE NEGATIVE Final    Comment:        The GeneXpert MRSA  Assay (FDA approved for NASAL specimens only), is one component of a comprehensive MRSA colonization surveillance program. It is not intended to diagnose MRSA infection nor to guide or monitor treatment for MRSA infections. Performed at [facility], [place] [address], [place], [place] [address]          Radiology Studies: No results found.      Scheduled Meds: . amLODipine  5 mg Oral Daily  . atorvastatin  20 mg Oral Daily  . brimonidine  1 drop Both Eyes BID   And  . timolol  1 drop Both Eyes BID  . carvedilol  12.5 mg Oral BID WC  . finasteride  5 mg Oral Daily  . heparin  5,000 Units Subcutaneous Q8H  . insulin aspart  0-5 Units Subcutaneous QHS  . insulin aspart  0-9 Units Subcutaneous TID WC  . polyethylene glycol  17 g Oral Daily   Continuous Infusions: . sodium chloride       LOS: 1 day    Time spent: More than 50% of that time was spent in counseling and/or coordination of care.      Shelly Coss, MD [facility] Pager [phone]  If 7PM-7AM, please contact night-coverage [email or web address] Password [facility] 05/13/2018, 8:24 AM

## 2018-05-13 NOTE — Social Work (Signed)
Insurance authorization pending with [employer] for pt to discharge to [facility].   Continuing to follow. Westley Hummer, MSW, [place] Work [phone]

## 2018-05-14 LAB — CULTURE, BLOOD (ROUTINE X 2)
CULTURE: NO GROWTH
CULTURE: NO GROWTH
Special Requests: ADEQUATE

## 2018-05-14 LAB — GLUCOSE, CAPILLARY
Glucose-Capillary: 94 mg/dL (ref 70–99)
Glucose-Capillary: 177 mg/dL — ABNORMAL HIGH (ref 70–99)
Glucose-Capillary: 75 mg/dL (ref 70–99)
Glucose-Capillary: 124 mg/dL — ABNORMAL HIGH (ref 70–99)

## 2018-05-14 NOTE — Progress Notes (Signed)
PROGRESS NOTE    Mario Warner.  WPV:[id number] DOB: August 22, 1916 DOA: 05/09/2018 PCP: Haywood Pao, MD   Brief Narrative: Patient is a 84 year old male with past medical history of hypertension, hyperlipidemia, diabetes, SDH, atrial flutter not on anticoagulation, prostate cancer, coronary artery disease status post pacemaker placement due to third-degree heart block who presented to the hospital with altered mental status, fall and back pain.  He was found to have lactic acidosis, hyperkalemia and acute kidney injury on presentation.  Overall status improving. Mental status is currently on baseline. Currently he is waiting for skilled nursing facility bed.  Assessment & Plan:   Principal Problem:   Acute metabolic encephalopathy Active Problems:   Type II diabetes mellitus with renal manifestations (HCC)   HLD (hyperlipidemia)   Essential hypertension   DISC DISEASE, LUMBAR   BPH (benign prostatic hyperplasia)   Atrial flutter (HCC)   Fall   Acute renal failure superimposed on stage 3 chronic kidney disease (HCC)   Hyperkalemia   Lactic acid acidosis   Prostate cancer metastatic to bone (HCC)   Dehydration   Acute metabolic encephalopathy: Improving.  Patient is alert and  oriented to place and person and likely this is his baseline.  Hemodynamically stable.  Altered mental status on presentation was multifactorial from dehydration, worsening renal function, electrolyte abnormalities.  Fall: No acute abnormalities on images.  PT/OT recommended skilled nursing facility.  Social worker following.  Waiting for rehab bed.  Dehydration/lactic acidosis: Improved with IV fluids.  No evidence of sepsis.  Blood cultures negative.  Encourage oral intake.  Hyperkalemia: Resolved  Acute kidney injury and CKD stage III: Resolved with IV fluids.  Currently kidney function is on baseline.  His baseline creatinine ranges from 1.4-1.5.  Diabetes mellitus: Last hemoglobin A1c 6.4 on  12/24/2011.  Continue sliding scale insulin.  Hyperlipidemia: Continue statin  Hypertension: Slightly hypertensive this morning.  We will continue to monitor.  Continue current regimen with amlodipine, Coreg, hydralazine as needed.  Back pain/degenerative joint disease: Continue supportive care.  Pain management  Hx ofAtrial flutter:CHA2DS2-VASc Scoreis 5, and was not considered anticoagulation as per the cardiology from outpatient. ContinueCoreg.  Prostate cancer metastatic to boneandBPH: onProscar.  Supportive care.  Patient is stable for discharge to skilled nursing facility as soon as the bed is available.  DVT prophylaxis: heparin [place] Code Status: Full Family Communication: None present at the bedside Disposition Plan: Skilled nursing facility as soon as bed is available   Consultants: None  Procedures:None  Antimicrobials:None  Subjective: Patient seen and examined the bedside this morning.  Remains hemodynamically stable.  Looked comfortable during my evaluation.  Alert and communicative .Denied any complains.  Objective: Vitals:   05/13/18 1719 05/13/18 1932 05/13/18 2313 05/14/18 0345  BP: 140/72 127/78 (!) 155/73 131/63  Pulse: 60 (!) 59 60 60  Resp: 18 20 18 18   Temp:  98.2 F (36.8 C) 98 F (36.7 C) 98.1 F (36.7 C)  TempSrc:  Oral Oral Oral  SpO2: 91% 100%      Intake/Output Summary (Last 24 hours) at 05/14/2018 7482 Last data filed at 05/13/2018 2151 Gross per 24 hour  Intake 120 ml  Output --  Net 120 ml   There were no vitals filed for this visit.  Examination:  General exam: Appears calm and comfortable ,Not in distress,average built HEENT:PERRL,Oral mucosa moist, Ear/Nose normal on gross exam Respiratory system: Bilateral equal air entry, normal vesicular breath sounds, no wheezes or crackles  Cardiovascular system: S1 & S2  heard, RRR. No JVD, murmurs, rubs, gallops or clicks. No pedal edema. Gastrointestinal system: Abdomen is  nondistended, soft and nontender. No organomegaly or masses felt. Normal bowel sounds heard. Central nervous system: Alert , oriented times 2 only . No focal neurological deficits. Extremities: No edema, no clubbing ,no cyanosis, distal peripheral pulses palpable. Skin: No rashes, lesions or ulcers,no icterus ,no pallor MSK: Normal muscle bulk,tone ,power Psychiatry: Judgement and insight appear impaired   Data Reviewed: I have personally reviewed following labs and imaging studies  CBC: Recent Labs  Lab 05/09/18 1605 05/10/18 0508 05/11/18 0240 05/12/18 0549 05/13/18 0532  WBC 7.8 8.4 5.2 4.3 3.9*  HGB 12.0* 9.6* 8.7* 9.4* 9.6*  HCT 36.9* 29.5* 26.9* 29.0* 30.0*  MCV 88.3 88.1 89.1 88.1 88.0  PLT 146* 128* 117* 138* 169*   Basic Metabolic Panel: Recent Labs  Lab 05/09/18 1605 05/10/18 0508 05/11/18 0240 05/12/18 0549 05/13/18 0532  NA 134* 140 137 138 138  K 6.0* 5.0 3.8 4.4 4.2  CL 103 113* 109 111 107  CO2 23 22 23 25 22   GLUCOSE 205* 147* 98 110* 100*  BUN 42* 34* 28* 24* 24*  CREATININE 2.23* 1.79* 1.78* 1.80* 1.57*  CALCIUM 9.5 8.5* 8.2* 8.5* 8.5*  MG  --   --  1.8  --   --    GFR: CrCl cannot be calculated (Unknown ideal weight.). Liver Function Tests: Recent Labs  Lab 05/09/18 1605  AST 26  ALT 13  ALKPHOS 79  BILITOT 0.4  PROT 7.2  ALBUMIN 3.6   Recent Labs  Lab 05/09/18 1605  LIPASE 29   No results for input(s): AMMONIA in the last 168 hours. Coagulation Profile: Recent Labs  Lab 05/09/18 1605  INR 1.05   Cardiac Enzymes: Recent Labs  Lab 05/09/18 1605  CKTOTAL 89   BNP (last 3 results) No results for input(s): PROBNP in the last 8760 hours. HbA1C: No results for input(s): HGBA1C in the last 72 hours. CBG: Recent Labs  Lab 05/13/18 0714 05/13/18 1119 05/13/18 1630 05/13/18 2119 05/14/18 0616  GLUCAP 83 114* 172* 124* 94   Lipid Profile: No results for input(s): CHOL, HDL, LDLCALC, TRIG, CHOLHDL, LDLDIRECT in the last 72  hours. Thyroid Function Tests: No results for input(s): TSH, T4TOTAL, FREET4, T3FREE, THYROIDAB in the last 72 hours. Anemia Panel: No results for input(s): VITAMINB12, FOLATE, FERRITIN, TIBC, IRON, RETICCTPCT in the last 72 hours. Sepsis Labs: Recent Labs  Lab 05/09/18 2301 05/10/18 0508 05/10/18 1116 05/10/18 1503  PROCALCITON 0.22  --   --   --   LATICACIDVEN 4.1* 2.4* 2.0* 1.8    Recent Results (from the past 240 hour(s))  Urine Culture     Status: None   Collection Time: 05/09/18  7:38 PM  Result Value Ref Range Status   Specimen Description URINE, RANDOM  Final   Special Requests NONE  Final   Culture   Final    NO GROWTH Performed at [facility], [place] [address] [place], [place] [address]    Report Status 05/10/2018 FINAL  Final  Culture, blood (Routine X 2) w Reflex to ID Panel     Status: None (Preliminary result)   Collection Time: 05/09/18  8:50 PM  Result Value Ref Range Status   Specimen Description BLOOD RIGHT ANTECUBITAL  Final   Special Requests   Final    BOTTLES DRAWN AEROBIC AND ANAEROBIC Blood Culture adequate volume   Culture   Final    NO  GROWTH 4 DAYS Performed at [facility], [place] [address] [place], [place] [address]    Report Status PENDING  Incomplete  Culture, blood (Routine X 2) w Reflex to ID Panel     Status: None (Preliminary result)   Collection Time: 05/09/18  8:50 PM  Result Value Ref Range Status   Specimen Description BLOOD RIGHT HAND  Final   Special Requests   Final    BOTTLES DRAWN AEROBIC AND ANAEROBIC Blood Culture results may not be optimal due to an inadequate volume of blood received in culture bottles   Culture   Final    NO GROWTH 4 DAYS Performed at [facility], [place] [address] [place], [place] [address]    Report Status PENDING  Incomplete  MRSA PCR Screening     Status: None   Collection Time: 05/10/18 12:43 AM  Result Value Ref Range Status   MRSA by PCR NEGATIVE NEGATIVE Final    Comment:         The GeneXpert MRSA Assay (FDA approved for NASAL specimens only), is one component of a comprehensive MRSA colonization surveillance program. It is not intended to diagnose MRSA infection nor to guide or monitor treatment for MRSA infections. Performed at [facility], [place] [address], [place], [place] [address]          Radiology Studies: No results found.      Scheduled Meds: . amLODipine  5 mg Oral Daily  . atorvastatin  20 mg Oral Daily  . brimonidine  1 drop Both Eyes BID   And  . timolol  1 drop Both Eyes BID  . carvedilol  12.5 mg Oral BID WC  . finasteride  5 mg Oral Daily  . heparin  5,000 Units Subcutaneous Q8H  . insulin aspart  0-5 Units Subcutaneous QHS  . insulin aspart  0-9 Units Subcutaneous TID WC  . polyethylene glycol  17 g Oral Daily   Continuous Infusions: . sodium chloride       LOS: 2 days    Time spent: 25 mins.More than 50% of that time was spent in counseling and/or coordination of care.      Shelly Coss, MD [facility] Pager [phone]  If 7PM-7AM, please contact night-coverage [email or web address] Password [facility] 05/14/2018, 9:17 AM

## 2018-05-15 LAB — BASIC METABOLIC PANEL
Anion gap: 5 (ref 5–15)
BUN: 25 mg/dL — AB (ref 8–23)
CO2: 28 mmol/L (ref 22–32)
Calcium: 8.4 mg/dL — ABNORMAL LOW (ref 8.9–10.3)
Chloride: 106 mmol/L (ref 98–111)
Creatinine, Ser: 1.66 mg/dL — ABNORMAL HIGH (ref 0.61–1.24)
GFR calc Af Amer: 39 mL/min — ABNORMAL LOW (ref >60)
GFR calc non Af Amer: 34 mL/min — ABNORMAL LOW (ref >60)
Glucose, Bld: 93 mg/dL (ref 70–99)
POTASSIUM: 4.1 mmol/L (ref 3.5–5.1)
Sodium: 139 mmol/L (ref 135–145)

## 2018-05-15 LAB — GLUCOSE, CAPILLARY
Glucose-Capillary: 83 mg/dL (ref 70–99)
Glucose-Capillary: 114 mg/dL — ABNORMAL HIGH (ref 70–99)
Glucose-Capillary: 147 mg/dL — ABNORMAL HIGH (ref 70–99)
Glucose-Capillary: 101 mg/dL — ABNORMAL HIGH (ref 70–99)

## 2018-05-15 MED ORDER — INSULIN ASPART 100 UNIT/ML ~~LOC~~ SOLN: 0.0000 [IU] | Freq: Three times a day (TID) | SUBCUTANEOUS | 0 refills | Status: AC

## 2018-05-15 MED ORDER — BRIMONIDINE TARTRATE 0.2 % OP SOLN: 1.0000 [drp] | Freq: Two times a day (BID) | OPHTHALMIC | 0 refills | Status: AC

## 2018-05-15 MED ORDER — OXYCODONE-ACETAMINOPHEN 5-325 MG PO TABS: 1.0000 | ORAL_TABLET | ORAL | 0 refills | Status: AC | PRN

## 2018-05-15 MED ORDER — POLYETHYLENE GLYCOL 3350 17 G PO PACK: 17.0000 g | PACK | Freq: Every day | ORAL | 0 refills | Status: AC

## 2018-05-15 MED ORDER — INSULIN ASPART 100 UNIT/ML ~~LOC~~ SOLN: 0.0000 [IU] | Freq: Every day | SUBCUTANEOUS | 0 refills | Status: AC

## 2018-05-15 MED ORDER — TIMOLOL MALEATE 0.5 % OP SOLN: 1.0000 [drp] | Freq: Two times a day (BID) | OPHTHALMIC | 0 refills | Status: AC

## 2018-05-15 NOTE — Clinical Social Work Note (Signed)
Insurance authorization pending for admission to [facility].  Mario Warner, Mario Warner

## 2018-05-15 NOTE — Discharge Summary (Signed)
Physician Discharge Summary  Mario Warner. MGQ:[id number] DOB: 05-18-1917 DOA: 05/09/2018  PCP: Haywood Pao, MD  Admit date: 05/09/2018 Discharge date: 05/15/2018  Admitted From: Home Disposition:  SNF  Discharge Condition:Stable CODE STATUS:FULL Diet recommendation: Heart Healthy   Brief/Interim Summary: Patient is a 84 year old male with past medical history of hypertension, hyperlipidemia, diabetes, SDH, atrial flutter not on anticoagulation, prostate cancer, coronary artery disease status post pacemaker placement due to third-degree heart block who presented to the hospital with altered mental status, fall and back pain.  He was found to have lactic acidosis, hyperkalemia and acute kidney injury on presentation.  Overall status improving. Mental status is currently on baseline. He is stable for discharge to skilled nursing facility today.  Following problems were addressed during his hospitalization:   Acute metabolic encephalopathy: Improved.  Patient is alert and  oriented to place and person and likely this is his baseline.  Hemodynamically stable.  Altered mental status on presentation was multifactorial from dehydration, worsening renal function, electrolyte abnormalities.  Fall: No acute abnormalities on images.  PT/OT recommended skilled nursing facility.  Social worker was following.  Dehydration/lactic acidosis: Improved with IV fluids.  No evidence of sepsis.  Blood cultures negative.  Encourage oral intake.  Hyperkalemia: Resolved  Acute kidney injury and CKD stage III: Resolved with IV fluids.  Currently kidney function is on baseline.  His baseline creatinine ranges from 1.4-1.5.  Diabetes mellitus: Last hemoglobin A1c 6.4 on 12/24/2011. Continue sliding scale insulin only.  Long-acting insulin discontinued.  Hyperlipidemia: Continue statin  Hypertension:  Continue current regimen with amlodipine and  Coreg.  Back pain/degenerative joint disease:  Continue supportive care.  Pain management  Hx ofAtrial flutter:CHA2DS2-VASc Scoreis 5, and was not considered anticoagulation as per the cardiology from outpatient. ContinueCoreg.  Prostate cancer metastatic to boneandBPH: onProscar. Supportive care.    Discharge Diagnoses:  Principal Problem:   Acute metabolic encephalopathy Active Problems:   Type II diabetes mellitus with renal manifestations (HCC)   HLD (hyperlipidemia)   Essential hypertension   DISC DISEASE, LUMBAR   BPH (benign prostatic hyperplasia)   Atrial flutter (HCC)   Fall   Acute renal failure superimposed on stage 3 chronic kidney disease (HCC)   Hyperkalemia   Lactic acid acidosis   Prostate cancer metastatic to bone [facility])   Dehydration    Discharge Instructions  Discharge Instructions    Diet - low sodium heart healthy   Complete by:  As directed    Discharge instructions   Complete by:  As directed    1) Take prescribed medications as instructed. 2)Do CBC and BMP tests in a week.   Increase activity slowly   Complete by:  As directed      Allergies as of 05/15/2018   No Known Allergies     Medication List    STOP taking these medications   insulin aspart protamine- aspart (70-30) 100 UNIT/ML injection Commonly known as:  NOVOLOG MIX 70/30     TAKE these medications   amLODipine 5 MG tablet Commonly known as:  NORVASC Take 1 tablet (5 mg total) by mouth daily.   brimonidine 0.2 % ophthalmic solution Commonly known as:  ALPHAGAN Place 1 drop into both eyes 2 (two) times daily.   carvedilol 12.5 MG tablet Commonly known as:  COREG Take 1 tablet (12.5 mg total) by mouth 2 (two) times daily with a meal.   finasteride 5 MG tablet Commonly known as:  PROSCAR Take 5 mg by mouth daily.  insulin aspart 100 UNIT/ML injection Commonly known as:  novoLOG Inject 0-5 Units into the skin at bedtime.   insulin aspart 100 UNIT/ML injection Commonly known as:  novoLOG Inject 0-9  Units into the skin 3 (three) times daily with meals.   oxyCODONE-acetaminophen 5-325 MG tablet Commonly known as:  PERCOCET/ROXICET Take 1 tablet by mouth every 4 (four) hours as needed for moderate pain.   polyethylene glycol packet Commonly known as:  MIRALAX / GLYCOLAX Take 17 g by mouth daily. What changed:  Another medication with the same name was added. Make sure you understand how and when to take each.   polyethylene glycol packet Commonly known as:  MIRALAX / GLYCOLAX Take 17 g by mouth daily. Start taking on:  05/16/2018 What changed:  You were already taking a medication with the same name, and this prescription was added. Make sure you understand how and when to take each.   timolol 0.5 % ophthalmic solution Commonly known as:  TIMOPTIC Place 1 drop into both eyes 2 (two) times daily.      Contact information for after-discharge care    Destination    [facility] Preferred SNF .   Service:  Skilled Nursing Contact information: [address] [place] [phone]             No Known Allergies  Consultations: None  Procedures/Studies: Ct Head Wo Contrast  Result Date: 05/09/2018 CLINICAL DATA:  84 year old male post fall.  Initial encounter. EXAM: CT HEAD WITHOUT CONTRAST CT CERVICAL SPINE WITHOUT CONTRAST TECHNIQUE: Multidetector CT imaging of the head and cervical spine was performed following the standard protocol without intravenous contrast. Multiplanar CT image reconstructions of the cervical spine were also generated. COMPARISON:  12/26/2011 head CT.  No comparison cervical spine CT. FINDINGS: CT HEAD FINDINGS Brain: No intracranial hemorrhage or CT evidence of large acute infarct. Chronic microvascular changes. Global atrophy. No intracranial mass lesion noted on this unenhanced exam. Vascular: Vascular calcifications Skull: No skull fracture Sinuses/Orbits: Post lens replacement. No acute orbital abnormality.  Minimal mucosal thickening left maxillary sinus. Other: Mastoid air cells and middle ear cavities are clear. CT CERVICAL SPINE FINDINGS Alignment: Mild curvature cervical spine. Slight retrolisthesis C6 and anterolisthesis C7 most likely secondary to facet degenerative changes. Skull base and vertebrae: No cervical spine fracture. Soft tissues: No abnormal prevertebral soft tissue swelling. Disc levels: Multilevel cervical spondylotic changes with various degrees of spinal stenosis and foraminal narrowing. Spinal stenosis most notable C3-4, C5-6 and C6-7. Upper chest: No worrisome abnormality. Other: No worrisome abnormality. IMPRESSION: Head CT: 1. No skull fracture or intracranial hemorrhage. 2. Chronic microvascular changes and atrophy. Cervical spine CT: 1. Mild curvature cervical spine. Slight retrolisthesis C6 and anterolisthesis C7 most likely secondary to facet degenerative changes. 2. No cervical spine fracture or abnormal prevertebral soft tissue swelling noted. 3. Multilevel cervical spondylotic changes with various degrees of spinal stenosis and foraminal narrowing. Spinal stenosis most notable C3-4, C5-6 and C6-7. Electronically Signed   By: Genia Del M.D.   On: 05/09/2018 18:24   Ct Cervical Spine Wo Contrast  Result Date: 05/09/2018 CLINICAL DATA:  84 year old male post fall.  Initial encounter. EXAM: CT HEAD WITHOUT CONTRAST CT CERVICAL SPINE WITHOUT CONTRAST TECHNIQUE: Multidetector CT imaging of the head and cervical spine was performed following the standard protocol without intravenous contrast. Multiplanar CT image reconstructions of the cervical spine were also generated. COMPARISON:  12/26/2011 head CT.  No comparison cervical spine CT. FINDINGS: CT HEAD FINDINGS  Brain: No intracranial hemorrhage or CT evidence of large acute infarct. Chronic microvascular changes. Global atrophy. No intracranial mass lesion noted on this unenhanced exam. Vascular: Vascular calcifications Skull: No  skull fracture Sinuses/Orbits: Post lens replacement. No acute orbital abnormality. Minimal mucosal thickening left maxillary sinus. Other: Mastoid air cells and middle ear cavities are clear. CT CERVICAL SPINE FINDINGS Alignment: Mild curvature cervical spine. Slight retrolisthesis C6 and anterolisthesis C7 most likely secondary to facet degenerative changes. Skull base and vertebrae: No cervical spine fracture. Soft tissues: No abnormal prevertebral soft tissue swelling. Disc levels: Multilevel cervical spondylotic changes with various degrees of spinal stenosis and foraminal narrowing. Spinal stenosis most notable C3-4, C5-6 and C6-7. Upper chest: No worrisome abnormality. Other: No worrisome abnormality. IMPRESSION: Head CT: 1. No skull fracture or intracranial hemorrhage. 2. Chronic microvascular changes and atrophy. Cervical spine CT: 1. Mild curvature cervical spine. Slight retrolisthesis C6 and anterolisthesis C7 most likely secondary to facet degenerative changes. 2. No cervical spine fracture or abnormal prevertebral soft tissue swelling noted. 3. Multilevel cervical spondylotic changes with various degrees of spinal stenosis and foraminal narrowing. Spinal stenosis most notable C3-4, C5-6 and C6-7. Electronically Signed   By: Genia Del M.D.   On: 05/09/2018 18:24   Ct Lumbar Spine Wo Contrast  Result Date: 05/09/2018 CLINICAL DATA:  Fall today.  Back pain.  History of prostate cancer. EXAM: CT LUMBAR SPINE WITHOUT CONTRAST TECHNIQUE: Multidetector CT imaging of the lumbar spine was performed without intravenous contrast administration. Multiplanar CT image reconstructions were also generated. COMPARISON:  CT abdomen pelvis 01/27/2018 FINDINGS: Segmentation: Normal segmentation. Small ribs at T12.  S1 is partially sacralized. Alignment: 8 mm anterolisthesis L3-4.  3 mm retrolisthesis L4-5. Vertebrae: 2.3 cm sclerotic lesion L1 vertebral body on the left is increased in size since the prior CT  abdomen pelvis and is consistent with metastatic disease. Sclerotic lesion right medial iliac bone incompletely evaluated but was present previously. Negative for lumbar fracture. Paraspinal and other soft tissues: Atherosclerotic aorta and iliacs without aneurysm. No retroperitoneal adenopathy. Disc levels: T11-12: Schmorl's node superior endplate of I69. Mild disc and facet degeneration without significant stenosis T12-L1: Broad-based disc protrusion. Bilateral facet degeneration. Moderate subarticular and foraminal stenosis bilaterally. Spinal canal adequate in size. L1-2: Moderate disc bulging. Moderate facet hypertrophy. Mild spinal stenosis. Mild subarticular stenosis bilaterally L2-3: Diffuse bulging of the disc. Moderate facet hypertrophy. Moderate spinal stenosis. Moderate subarticular and foraminal stenosis bilaterally L3-4: 8 mm anterolisthesis. Diffuse disc bulging. Severe facet hypertrophy. Probable laminectomy at this level in the past. Severe spinal stenosis. Severe subarticular and foraminal stenosis bilaterally with impingement of the L3 and L4 nerve roots bilaterally. L4-5: 3 mm retrolisthesis. Diffuse bulging of the disc. Moderate facet hypertrophy. Moderate spinal stenosis. Moderate subarticular stenosis bilaterally L5-S1: L5 transverse processes articulate with the sacrum. No significant degenerative change. IMPRESSION: 1. Sclerotic lesion L1 vertebral body with progression since CT of 01/27/2018 compatible with progressive prostate cancer metastasis. Sclerotic lesion right medial iliac bone also consistent with metastatic disease although incompletely evaluated on the study. 2. Extensive multilevel lumbar degenerative change as above. Multilevel spinal and subarticular stenosis, most severe at L2-3, L3-4, L4-5. Electronically Signed   By: Franchot Gallo M.D.   On: 05/09/2018 18:28   Dg Pelvis Portable  Result Date: 05/09/2018 CLINICAL DATA:  84 year old male with a history of fall EXAM:  PORTABLE PELVIS 1-2 VIEWS COMPARISON:  CT 01/27/2018 FINDINGS: Osteopenia. Bony pelvic ring intact with no acute fracture line identified. Bilateral hips projects normally over  the acetabula. Unremarkable proximal femurs. Pelvic phleboliths. Degenerative changes of the spine. Sclerotic focus of the right iliac bone again demonstrated, present on the prior CT study IMPRESSION: Negative for acute bony abnormality. Sclerotic focus of the right iliac bone again demonstrated which was present on prior CT study. Again, metastases not excluded and correlation with a history of malignancy recommended. Electronically Signed   By: Corrie Mckusick D.O.   On: 05/09/2018 17:10   Dg Chest Port 1 View  Result Date: 05/09/2018 CLINICAL DATA:  Possible fall today. Known fall several weeks ago. No current chest pain or shortness of breath. History of hypertension, coronary artery disease, complete heart block. EXAM: PORTABLE CHEST 1 VIEW COMPARISON:  Portable chest x-ray of January 27, 2018 FINDINGS: The lungs are mildly hyperinflated. There is no focal infiltrate, pleural effusion, or pneumothorax. The heart and pulmonary vascularity are normal. The mediastinum is normal in width. There is calcification in the wall of the aortic arch. The ICD is in stable position. The observed bony thorax exhibits no acute abnormality. IMPRESSION: COPD. No pneumonia, CHF, nor other acute cardiopulmonary abnormality. No evidence of acute post traumatic injury of the thorax. Electronically Signed   By: David  Martinique M.D.   On: 05/09/2018 17:06       Subjective: Patient is seen and examined the bedside this morning.  Remains comfortable.  Alert and communicative.  Stable for discharge today to SNF.  Discharge Exam: Vitals:   05/15/18 0821 05/15/18 1205  BP: (!) 116/57 106/62  Pulse: 60 63  Resp:  18  Temp:  98.4 F (36.9 C)  SpO2:  100%   Vitals:   05/14/18 2351 05/15/18 0347 05/15/18 0821 05/15/18 1205  BP: (!) 101/50 120/68  (!) 116/57 106/62  Pulse: (!) 59 60 60 63  Resp: 20 18  18   Temp: 98 F (36.7 C) 98.4 F (36.9 C)  98.4 F (36.9 C)  TempSrc: Oral Oral  Oral  SpO2: 100% 100%  100%    General: Pt is alert, awake, not in acute distress Cardiovascular: RRR, S1/S2 +, no rubs, no gallops Respiratory: CTA bilaterally, no wheezing, no rhonchi Abdominal: Soft, NT, ND, bowel sounds + Extremities: no edema, no cyanosis    The results of significant diagnostics from this hospitalization (including imaging, microbiology, ancillary and laboratory) are listed below for reference.     Microbiology: Recent Results (from the past 240 hour(s))  Urine Culture     Status: None   Collection Time: 05/09/18  7:38 PM  Result Value Ref Range Status   Specimen Description URINE, RANDOM  Final   Special Requests NONE  Final   Culture   Final    NO GROWTH Performed at [facility], [address] [place], [place] [address]    Report Status 05/10/2018 FINAL  Final  Culture, blood (Routine X 2) w Reflex to ID Panel     Status: None   Collection Time: 05/09/18  8:50 PM  Result Value Ref Range Status   Specimen Description BLOOD RIGHT ANTECUBITAL  Final   Special Requests   Final    BOTTLES DRAWN AEROBIC AND ANAEROBIC Blood Culture adequate volume   Culture   Final    NO GROWTH 5 DAYS Performed at [facility], [place] [address] [place], [place] [address]    Report Status 05/14/2018 FINAL  Final  Culture, blood (Routine X 2) w Reflex to ID Panel     Status: None   Collection Time: 05/09/18  8:50 PM  Result Value Ref Range Status   Specimen Description BLOOD RIGHT HAND  Final   Special Requests   Final    BOTTLES DRAWN AEROBIC AND ANAEROBIC Blood Culture results may not be optimal due to an inadequate volume of blood received in culture bottles   Culture   Final    NO GROWTH 5 DAYS Performed at [facility] [address], [place], [place] [address]    Report Status 05/14/2018 FINAL  Final   MRSA PCR Screening     Status: None   Collection Time: 05/10/18 12:43 AM  Result Value Ref Range Status   MRSA by PCR NEGATIVE NEGATIVE Final    Comment:        The GeneXpert MRSA Assay (FDA approved for NASAL specimens only), is one component of a comprehensive MRSA colonization surveillance program. It is not intended to diagnose MRSA infection nor to guide or monitor treatment for MRSA infections. Performed at [facility], [place] [address] [place], [place] [address]      Labs: BNP (last 3 results) No results for input(s): BNP in the last 8760 hours. Basic Metabolic Panel: Recent Labs  Lab 05/10/18 0508 05/11/18 0240 05/12/18 0549 05/13/18 0532 05/15/18 0357  NA 140 137 138 138 139  K 5.0 3.8 4.4 4.2 4.1  CL 113* 109 111 107 106  CO2 22 23 25 22 28   GLUCOSE 147* 98 110* 100* 93  BUN 34* 28* 24* 24* 25*  CREATININE 1.79* 1.78* 1.80* 1.57* 1.66*  CALCIUM 8.5* 8.2* 8.5* 8.5* 8.4*  MG  --  1.8  --   --   --    Liver Function Tests: Recent Labs  Lab 05/09/18 1605  AST 26  ALT 13  ALKPHOS 79  BILITOT 0.4  PROT 7.2  ALBUMIN 3.6   Recent Labs  Lab 05/09/18 1605  LIPASE 29   No results for input(s): AMMONIA in the last 168 hours. CBC: Recent Labs  Lab 05/09/18 1605 05/10/18 0508 05/11/18 0240 05/12/18 0549 05/13/18 0532  WBC 7.8 8.4 5.2 4.3 3.9*  HGB 12.0* 9.6* 8.7* 9.4* 9.6*  HCT 36.9* 29.5* 26.9* 29.0* 30.0*  MCV 88.3 88.1 89.1 88.1 88.0  PLT 146* 128* 117* 138* 131*   Cardiac Enzymes: Recent Labs  Lab 05/09/18 1605  CKTOTAL 89   BNP: Invalid input(s): POCBNP CBG: Recent Labs  Lab 05/14/18 1153 05/14/18 1653 05/14/18 2152 05/15/18 0614 05/15/18 1207  GLUCAP 177* 75 124* 83 114*   D-Dimer No results for input(s): DDIMER in the last 72 hours. Hgb A1c No results for input(s): HGBA1C in the last 72 hours. Lipid Profile No results for input(s): CHOL, HDL, LDLCALC, TRIG, CHOLHDL, LDLDIRECT in the last 72 hours. Thyroid  function studies No results for input(s): TSH, T4TOTAL, T3FREE, THYROIDAB in the last 72 hours.  Invalid input(s): FREET3 Anemia work up No results for input(s): VITAMINB12, FOLATE, FERRITIN, TIBC, IRON, RETICCTPCT in the last 72 hours. Urinalysis    Component Value Date/Time   COLORURINE YELLOW 05/09/2018 1938   APPEARANCEUR HAZY (A) 05/09/2018 1938   LABSPEC 1.017 05/09/2018 1938   PHURINE 6.0 05/09/2018 1938   GLUCOSEU NEGATIVE 05/09/2018 1938   HGBUR NEGATIVE 05/09/2018 1938   BILIRUBINUR NEGATIVE 05/09/2018 1938   KETONESUR NEGATIVE 05/09/2018 1938   PROTEINUR 30 (A) 05/09/2018 1938   UROBILINOGEN 1.0 12/24/2011 1413   NITRITE NEGATIVE 05/09/2018 1938   LEUKOCYTESUR NEGATIVE 05/09/2018 1938   Sepsis Labs Invalid input(s): PROCALCITONIN,  WBC,  [place] Microbiology Recent Results (from the past 240 hour(s))  Urine Culture     Status: None   Collection Time: 05/09/18  7:38 PM  Result Value Ref Range Status   Specimen Description URINE, RANDOM  Final   Special Requests NONE  Final   Culture   Final    NO GROWTH Performed at [facility], [address] [place], [place] [address]    Report Status 05/10/2018 FINAL  Final  Culture, blood (Routine X 2) w Reflex to ID Panel     Status: None   Collection Time: 05/09/18  8:50 PM  Result Value Ref Range Status   Specimen Description BLOOD RIGHT ANTECUBITAL  Final   Special Requests   Final    BOTTLES DRAWN AEROBIC AND ANAEROBIC Blood Culture adequate volume   Culture   Final    NO GROWTH 5 DAYS Performed at [facility], [place] [address], [place], [place] [address]    Report Status 05/14/2018 FINAL  Final  Culture, blood (Routine X 2) w Reflex to ID Panel     Status: None   Collection Time: 05/09/18  8:50 PM  Result Value Ref Range Status   Specimen Description BLOOD RIGHT HAND  Final   Special Requests   Final    BOTTLES DRAWN AEROBIC AND ANAEROBIC Blood Culture results may not be optimal due to an  inadequate volume of blood received in culture bottles   Culture   Final    NO GROWTH 5 DAYS Performed at [facility], [place] [address], [place], [place] [address]    Report Status 05/14/2018 FINAL  Final  MRSA PCR Screening     Status: None   Collection Time: 05/10/18 12:43 AM  Result Value Ref Range Status   MRSA by PCR NEGATIVE NEGATIVE Final    Comment:        The GeneXpert MRSA Assay (FDA approved for NASAL specimens only), is one component of a comprehensive MRSA colonization surveillance program. It is not intended to diagnose MRSA infection nor to guide or monitor treatment for MRSA infections. Performed at [facility], [place] [address] [place], [place] [address]     Please note: You were cared for by a hospitalist during your hospital stay. Once you are discharged, your primary care physician will handle any further medical issues. Please note that NO REFILLS for any discharge medications will be authorized once you are discharged, as it is imperative that you return to your primary care physician (or establish a relationship with a primary care physician if you do not have one) for your post hospital discharge needs so that they can reassess your need for medications and monitor your lab values.    Time coordinating discharge: 40 minutes  SIGNED:   Shelly Coss, MD  [facility] 05/15/2018, 1:55 PM Pager [phone]  If 7PM-7AM, please contact night-coverage [email or web address] Password [email or web address]

## 2018-05-15 NOTE — Progress Notes (Signed)
Occupational Therapy Treatment Patient Details Name: Mario Warner. MRN: [id number] DOB: 1917-01-06 Today's Date: 05/15/2018    History of present illness 84 y.o. male with medical history significant of hypertension, hyperlipidemia, diet-controlled diabetes, gout, SDH, atrial flutter not on anticoagulants, prostate cancer, CAD, pacemaker placement due to third-degree AV block, peptic ulcer disease, chronic back pain, who presents with altered mental status, fall and back pain.   OT comments  Pt seen for OT ADL retraining session with focus on ADL's, bed mobility and functional transfers in preparation for increased participation w/ ADL's. Pt with noted fear of falling during sit to stand and transfers and he appears to do well with VC's for safety, sequencing/step by step instructions to help alleivate fears. Rec SNF Rehab & 24/7 Assist.   Follow Up Recommendations  SNF;Supervision/Assistance - 24 hour    Equipment Recommendations  Other (comment)(Defer to next venue)    Recommendations for Other Services      Precautions / Restrictions Precautions Precautions: Fall Restrictions Weight Bearing Restrictions: No       Mobility Bed Mobility Overal bed mobility: Needs Assistance Bed Mobility: Supine to Sit     Supine to sit: Min assist     General bed mobility comments: Pt requires increased time and min A for trunk elevation.  Min A for moving B LE over EOB and squaring up hips.  Transfers Overall transfer level: Needs assistance Equipment used: None(Pt declined RW use and requested placing hands on OT shoulders during transfers) Transfers: Sit to/from [facility] Sit to Stand: Min assist;Mod assist Stand pivot transfers: Min assist            Balance Overall balance assessment: Needs assistance Sitting-balance support: Feet supported Sitting balance-Leahy Scale: Good     Standing balance support: Bilateral upper extremity supported;During  functional activity Standing balance-Leahy Scale: Poor                             ADL either performed or assessed with clinical judgement   ADL Overall ADL's : Needs assistance/impaired     Grooming: Sitting;Set up;Oral care   Upper Body Bathing: Set up;Sitting     Lower Body Bathing Details (indicate cue type and reason): NT performed LB bathing at bed level today prior to OT arrival in room. Upper Body Dressing : Moderate assistance;Bed level Upper Body Dressing Details (indicate cue type and reason): Donning gown after ADL's Lower Body Dressing: Maximal assistance Lower Body Dressing Details (indicate cue type and reason): Donning socks bilateral feet. Min-ModA sit to stand Toilet Transfer: Minimal assistance;Stand-pivot;BSC(Simulated transfer from EOB to recliner chair) Toilet Transfer Details (indicate cue type and reason): Increased time and VC's for hand placement, sequencing. Pt with noted fear of falling in standing. Toileting- Water quality scientist and Hygiene: Sit to/from stand;Moderate assistance       Functional mobility during ADLs: Minimal assistance General ADL Comments: Pt seen for OT ADLretraining session with focus on ADL's, bed mobility and functional transfers in preparation for increased participation w/ ADL's. Pt with noted fear of falling during sit to stand and transfers and he appears to do well with VC's for safety, sequencing/step by step instructions to help alleivate fears. Rec SNF Rehab & 24/7 Assist    Vision Patient Visual Report: No change from baseline     Perception     Praxis      Cognition Arousal/Alertness: Awake/alert Behavior During Therapy: WFL for tasks assessed/performed Overall Cognitive Status:  No family/caregiver present to determine baseline cognitive functioning                                 General Comments: pt unreliable historian, requires mod cuing for simple questions        Exercises      Shoulder Instructions       General Comments      Pertinent Vitals/ Pain       Pain Assessment: Faces(Pt denies pain however was with noted facial grimmace w/ bed mobility & standing ) Faces Pain Scale: Hurts a little bit Pain Location: Hips/knees Pain Descriptors / Indicators: Grimacing;Discomfort;Aching Pain Intervention(s): Limited activity within patient's tolerance;Monitored during session;Repositioned  Home Living                                          Prior Functioning/Environment              Frequency  Min 2X/week        Progress Toward Goals  OT Goals(current goals can now be found in the care plan section)  Progress towards OT goals: Progressing toward goals  Acute Rehab OT Goals Patient Stated Goal: none stated  Plan Discharge plan remains appropriate    Co-evaluation                 AM-PAC OT "6 Clicks" Daily Activity     Outcome Measure   Help from another person eating meals?: A Little Help from another person taking care of personal grooming?: A Little Help from another person toileting, which includes using toliet, bedpan, or urinal?: A Lot Help from another person bathing (including washing, rinsing, drying)?: A Lot Help from another person to put on and taking off regular upper body clothing?: A Little Help from another person to put on and taking off regular lower body clothing?: A Lot 6 Click Score: 15    End of Session Equipment Utilized During Treatment: Gait belt  OT Visit Diagnosis: Unsteadiness on feet (R26.81);Repeated falls (R29.6);Muscle weakness (generalized) (M62.81);History of falling (Z91.81)   Activity Tolerance Patient tolerated treatment well   Patient Left in chair;with call bell/phone within reach;with chair alarm set   Nurse Communication Mobility status;Other (comment)(Sitting up in chair)        Time: 3338-3291 OT Time Calculation (min): 18 min  Charges: OT General Charges $OT  Visit: 1 Visit OT Treatments $Self Care/Home Management : 8-22 mins    Mario Warner Beth Dixon, OTR/L 05/15/2018, 12:42 PM

## 2018-05-15 NOTE — Consult Note (Signed)
   [facility] CM Inpatient Consult   05/15/2018  Mario Warner. 1917/04/07 [id number]     Patient screened for potential [facility] Care Management services due to unplanned readmission risk score of 25% (high).  Spoke with inpatient RNCM. [name] discharge plan is for SNF.   There are no identifiable [facility] Care Management needs at this time.   Marthenia Rolling, MSN-Ed, RN,BSN [facility] Liaison [phone]

## 2018-05-15 NOTE — Care Management Important Message (Signed)
Important Message  Patient Details  Name: Mario Warner. MRN: [id number] Date of Birth: 15-Mar-1917   Medicare Important Message Given:  Yes    Evynn Boutelle 05/15/2018, 4:48 PM

## 2018-05-16 DIAGNOSIS — R410 Disorientation, unspecified: Secondary | ICD-10-CM | POA: Diagnosis not present

## 2018-05-16 DIAGNOSIS — M109 Gout, unspecified: Secondary | ICD-10-CM | POA: Diagnosis not present

## 2018-05-16 DIAGNOSIS — Z950 Presence of cardiac pacemaker: Secondary | ICD-10-CM | POA: Diagnosis not present

## 2018-05-16 DIAGNOSIS — R4182 Altered mental status, unspecified: Secondary | ICD-10-CM | POA: Diagnosis not present

## 2018-05-16 DIAGNOSIS — M2550 Pain in unspecified joint: Secondary | ICD-10-CM | POA: Diagnosis not present

## 2018-05-16 DIAGNOSIS — Z9181 History of falling: Secondary | ICD-10-CM | POA: Diagnosis not present

## 2018-05-16 DIAGNOSIS — G9341 Metabolic encephalopathy: Secondary | ICD-10-CM | POA: Diagnosis not present

## 2018-05-16 DIAGNOSIS — E785 Hyperlipidemia, unspecified: Secondary | ICD-10-CM | POA: Diagnosis not present

## 2018-05-16 DIAGNOSIS — M6281 Muscle weakness (generalized): Secondary | ICD-10-CM | POA: Diagnosis not present

## 2018-05-16 DIAGNOSIS — E1129 Type 2 diabetes mellitus with other diabetic kidney complication: Secondary | ICD-10-CM | POA: Diagnosis not present

## 2018-05-16 DIAGNOSIS — R4189 Other symptoms and signs involving cognitive functions and awareness: Secondary | ICD-10-CM | POA: Diagnosis not present

## 2018-05-16 DIAGNOSIS — Z7401 Bed confinement status: Secondary | ICD-10-CM | POA: Diagnosis not present

## 2018-05-16 DIAGNOSIS — I2510 Atherosclerotic heart disease of native coronary artery without angina pectoris: Secondary | ICD-10-CM | POA: Diagnosis not present

## 2018-05-16 DIAGNOSIS — M25569 Pain in unspecified knee: Secondary | ICD-10-CM | POA: Diagnosis not present

## 2018-05-16 DIAGNOSIS — N183 Chronic kidney disease, stage 3 (moderate): Secondary | ICD-10-CM | POA: Diagnosis not present

## 2018-05-16 DIAGNOSIS — R262 Difficulty in walking, not elsewhere classified: Secondary | ICD-10-CM | POA: Diagnosis not present

## 2018-05-16 DIAGNOSIS — R5381 Other malaise: Secondary | ICD-10-CM | POA: Diagnosis not present

## 2018-05-16 DIAGNOSIS — I4892 Unspecified atrial flutter: Secondary | ICD-10-CM | POA: Diagnosis not present

## 2018-05-16 DIAGNOSIS — F05 Delirium due to known physiological condition: Secondary | ICD-10-CM | POA: Diagnosis not present

## 2018-05-16 DIAGNOSIS — W19XXXA Unspecified fall, initial encounter: Secondary | ICD-10-CM | POA: Diagnosis not present

## 2018-05-16 DIAGNOSIS — I483 Typical atrial flutter: Secondary | ICD-10-CM | POA: Diagnosis not present

## 2018-05-16 DIAGNOSIS — I10 Essential (primary) hypertension: Secondary | ICD-10-CM | POA: Diagnosis not present

## 2018-05-16 DIAGNOSIS — E119 Type 2 diabetes mellitus without complications: Secondary | ICD-10-CM | POA: Diagnosis not present

## 2018-05-16 DIAGNOSIS — Z8546 Personal history of malignant neoplasm of prostate: Secondary | ICD-10-CM | POA: Diagnosis not present

## 2018-05-16 LAB — GLUCOSE, CAPILLARY
Glucose-Capillary: 92 mg/dL (ref 70–99)
Glucose-Capillary: 126 mg/dL — ABNORMAL HIGH (ref 70–99)

## 2018-05-16 NOTE — Progress Notes (Signed)
VSS. Pt AOx4 with periods of confusion. Belongings given to paramedic.  Report given to [facility] RN. Pt left to go to rehab via [facility] @ approximately 1230.

## 2018-05-16 NOTE — Progress Notes (Signed)
PROGRESS NOTE    Mario Warner.  NWG:[id number] DOB: 24-Apr-1917 DOA: 05/09/2018 PCP: Haywood Pao, MD   Brief Narrative: Patient is a 84 year old male with past medical history of hypertension, hyperlipidemia, diabetes, SDH, atrial flutter not on anticoagulation, prostate cancer, coronary artery disease status post pacemaker placement due to third-degree heart block who presented to the hospital with altered mental status, fall and back pain.  He was found to have lactic acidosis, hyperkalemia and acute kidney injury on presentation.  Overall status improving. Mental status is currently on baseline. Currently he is waiting for skilled nursing facility bed.  Assessment & Plan:   Principal Problem:   Acute metabolic encephalopathy Active Problems:   Type II diabetes mellitus with renal manifestations (HCC)   HLD (hyperlipidemia)   Essential hypertension   DISC DISEASE, LUMBAR   BPH (benign prostatic hyperplasia)   Atrial flutter (HCC)   Fall   Acute renal failure superimposed on stage 3 chronic kidney disease (HCC)   Hyperkalemia   Lactic acid acidosis   Prostate cancer metastatic to bone [facility])   Dehydration  Patient is a 84 year old male with past medical history of hypertension, hyperlipidemia, diabetes, SDH, atrial flutter not on anticoagulation, prostate cancer, coronary artery disease status post pacemaker placement due to third-degree heart block who presented to the hospital with altered mental status, fall and back pain. He was found to have lactic acidosis, hyperkalemia and acute kidney injury on presentation. Overall status improving. Mental status is currently on baseline.He is stable for discharge to skilled nursing facility today.  Following problems were addressed during his hospitalization:   Acute metabolic encephalopathy: Improved. Patient is alert andoriented to place and person and likely this is his baseline. Hemodynamically stable. Altered mental  status on presentation was multifactorial from dehydration, worsening renal function, electrolyte abnormalities.  Fall: No acute abnormalities on images. PT/OT recommended skilled nursing facility. Social worker was following.  Dehydration/lactic acidosis: Improved with IV fluids. No evidence of sepsis. Blood cultures negative. Encourage oral intake.  Hyperkalemia: Resolved  Acute kidney injury and CKD stage III: Resolved with IV fluids. Currently kidney function is on baseline. His baseline creatinine ranges from 1.4-1.5.  Diabetes mellitus: Last hemoglobin A1c 6.4 on 12/24/2011. Continue sliding scale insulin only.  Long-acting insulin discontinued.  Hyperlipidemia:Continue statin  Hypertension:Continue current regimen with amlodipine and  Coreg.  Back pain/degenerative joint disease: Continue supportive care. Pain management  Hx ofAtrial flutter:CHA2DS2-VASc Scoreis 5, and was not considered anticoagulation as per the cardiology from outpatient. ContinueCoreg.  Prostate cancer metastatic to boneandBPH:onProscar. Supportive care.  DVT prophylaxis: heparin [place] Code Status: Full Family Communication: I called his daughter yesterday and left a voice message, he did not receive a call back Disposition Plan: Skilled nursing facility as soon as bed is available   Consultants: None  Procedures:None  Antimicrobials:None  Subjective: Patient seen and examined the bedside this morning.  Remains hemodynamically stable.  Looked comfortable during my evaluation.  Alert and communicative .Denied any complains.  Objective: Vitals:   05/16/18 0026 05/16/18 0333 05/16/18 0750 05/16/18 0829  BP: 114/69 (!) 124/91 (!) 150/85   Pulse: 60 (!) 59 (!) 57 60  Resp: 20 18 18    Temp: 98.1 F (36.7 C) 98.1 F (36.7 C) 97.8 F (36.6 C)   TempSrc: Oral Oral Oral   SpO2: 100% 94% 99%     Intake/Output Summary (Last 24 hours) at 05/16/2018 1019 Last data filed at  05/16/2018 0059 Gross per 24 hour  Intake -  Output 200  ml  Net -200 ml   There were no vitals filed for this visit.  Examination:  General exam: Appears calm and comfortable ,Not in distress,average built HEENT:PERRL,Oral mucosa moist, Ear/Nose normal on gross exam Respiratory system: Bilateral equal air entry, normal vesicular breath sounds, no wheezes or crackles  Cardiovascular system: S1 & S2 heard, RRR. No JVD, murmurs, rubs, gallops or clicks. No pedal edema. Gastrointestinal system: Abdomen is nondistended, soft and nontender. No organomegaly or masses felt. Normal bowel sounds heard. Central nervous system: Alert , oriented times 2 only . No focal neurological deficits. Extremities: No edema, no clubbing ,no cyanosis, distal peripheral pulses palpable. Skin: No rashes, lesions or ulcers,no icterus ,no pallor MSK: Normal muscle bulk,tone ,power Psychiatry: Judgement and insight appear impaired   Data Reviewed: I have personally reviewed following labs and imaging studies  CBC: Recent Labs  Lab 05/09/18 1605 05/10/18 0508 05/11/18 0240 05/12/18 0549 05/13/18 0532  WBC 7.8 8.4 5.2 4.3 3.9*  HGB 12.0* 9.6* 8.7* 9.4* 9.6*  HCT 36.9* 29.5* 26.9* 29.0* 30.0*  MCV 88.3 88.1 89.1 88.1 88.0  PLT 146* 128* 117* 138* 626*   Basic Metabolic Panel: Recent Labs  Lab 05/10/18 0508 05/11/18 0240 05/12/18 0549 05/13/18 0532 05/15/18 0357  NA 140 137 138 138 139  K 5.0 3.8 4.4 4.2 4.1  CL 113* 109 111 107 106  CO2 22 23 25 22 28   GLUCOSE 147* 98 110* 100* 93  BUN 34* 28* 24* 24* 25*  CREATININE 1.79* 1.78* 1.80* 1.57* 1.66*  CALCIUM 8.5* 8.2* 8.5* 8.5* 8.4*  MG  --  1.8  --   --   --    GFR: CrCl cannot be calculated (Unknown ideal weight.). Liver Function Tests: Recent Labs  Lab 05/09/18 1605  AST 26  ALT 13  ALKPHOS 79  BILITOT 0.4  PROT 7.2  ALBUMIN 3.6   Recent Labs  Lab 05/09/18 1605  LIPASE 29   No results for input(s): AMMONIA in the last 168  hours. Coagulation Profile: Recent Labs  Lab 05/09/18 1605  INR 1.05   Cardiac Enzymes: Recent Labs  Lab 05/09/18 1605  CKTOTAL 89   BNP (last 3 results) No results for input(s): PROBNP in the last 8760 hours. HbA1C: No results for input(s): HGBA1C in the last 72 hours. CBG: Recent Labs  Lab 05/15/18 0614 05/15/18 1207 05/15/18 1642 05/15/18 2107 05/16/18 0607  GLUCAP 83 114* 147* 101* 92   Lipid Profile: No results for input(s): CHOL, HDL, LDLCALC, TRIG, CHOLHDL, LDLDIRECT in the last 72 hours. Thyroid Function Tests: No results for input(s): TSH, T4TOTAL, FREET4, T3FREE, THYROIDAB in the last 72 hours. Anemia Panel: No results for input(s): VITAMINB12, FOLATE, FERRITIN, TIBC, IRON, RETICCTPCT in the last 72 hours. Sepsis Labs: Recent Labs  Lab 05/09/18 2301 05/10/18 0508 05/10/18 1116 05/10/18 1503  PROCALCITON 0.22  --   --   --   LATICACIDVEN 4.1* 2.4* 2.0* 1.8    Recent Results (from the past 240 hour(s))  Urine Culture     Status: None   Collection Time: 05/09/18  7:38 PM  Result Value Ref Range Status   Specimen Description URINE, RANDOM  Final   Special Requests NONE  Final   Culture   Final    NO GROWTH Performed at [facility], [place] [address], [place], [place] [address]    Report Status 05/10/2018 FINAL  Final  Culture, blood (Routine X 2) w Reflex to ID Panel     Status:  None   Collection Time: 05/09/18  8:50 PM  Result Value Ref Range Status   Specimen Description BLOOD RIGHT ANTECUBITAL  Final   Special Requests   Final    BOTTLES DRAWN AEROBIC AND ANAEROBIC Blood Culture adequate volume   Culture   Final    NO GROWTH 5 DAYS Performed at [facility], [address] [place], [place] [address]    Report Status 05/14/2018 FINAL  Final  Culture, blood (Routine X 2) w Reflex to ID Panel     Status: None   Collection Time: 05/09/18  8:50 PM  Result Value Ref Range Status   Specimen Description BLOOD RIGHT HAND  Final    Special Requests   Final    BOTTLES DRAWN AEROBIC AND ANAEROBIC Blood Culture results may not be optimal due to an inadequate volume of blood received in culture bottles   Culture   Final    NO GROWTH 5 DAYS Performed at [facility], [place] [address], [place], [place] [address]    Report Status 05/14/2018 FINAL  Final  MRSA PCR Screening     Status: None   Collection Time: 05/10/18 12:43 AM  Result Value Ref Range Status   MRSA by PCR NEGATIVE NEGATIVE Final    Comment:        The GeneXpert MRSA Assay (FDA approved for NASAL specimens only), is one component of a comprehensive MRSA colonization surveillance program. It is not intended to diagnose MRSA infection nor to guide or monitor treatment for MRSA infections. Performed at [facility], [place] [address] [place], [place] [address]          Radiology Studies: No results found.      Scheduled Meds: . amLODipine  5 mg Oral Daily  . atorvastatin  20 mg Oral Daily  . brimonidine  1 drop Both Eyes BID   And  . timolol  1 drop Both Eyes BID  . carvedilol  12.5 mg Oral BID WC  . finasteride  5 mg Oral Daily  . heparin  5,000 Units Subcutaneous Q8H  . insulin aspart  0-5 Units Subcutaneous QHS  . insulin aspart  0-9 Units Subcutaneous TID WC  . polyethylene glycol  17 g Oral Daily   Continuous Infusions: . sodium chloride       LOS: 4 days    Time spent: 25 mins.More than 50% of that time was spent in counseling and/or coordination of care.      Shelly Coss, MD [facility] Pager [phone]  If 7PM-7AM, please contact night-coverage [email or web address] Password [email or web address] 05/16/2018, 10:19 AM

## 2018-05-16 NOTE — Progress Notes (Signed)
Physical Therapy Treatment Patient Details Name: Mario Warner. MRN: [id number] DOB: 04/01/1917 Today's Date: 05/16/2018    History of Present Illness 84 y.o. male with medical history significant of hypertension, hyperlipidemia, diet-controlled diabetes, gout, SDH, atrial flutter not on anticoagulants, prostate cancer, CAD, pacemaker placement due to third-degree AV block, peptic ulcer disease, chronic back pain, who presents with altered mental status, fall and back pain.    PT Comments    Patient's tolerance to treatment today was good. Patient was in chair with no visitors present upon PT arrival.  Patient was pleasant and agreeable to therapy.  Patient completed ambulation in hallway with RW continuing to require VC to remain within RW for safety.  Patient was able to participate in seated LE exercises requiring VC and tactile cueing for proper technique but demonstrated good activity tolerance today.  Patient remains a good candidate for SNF placement at this time based on current functional status.    Follow Up Recommendations  SNF     Equipment Recommendations  None recommended by PT    Recommendations for Other Services       Precautions / Restrictions Precautions Precautions: Fall Restrictions Weight Bearing Restrictions: No    Mobility  Bed Mobility                  Transfers Overall transfer level: Needs assistance Equipment used: Rolling walker (2 wheeled) Transfers: Sit to/from Stand Sit to Stand: Min assist         General transfer comment: Pt continues to require VC for safe hand placement on armrests prior to standing.  VC provided for pt to scoot to edge of chair.  Pt required min A to boost into standing.  VC and tactile cues required for reaching back to armrests prior to sitting.  Ambulation/Gait Ambulation/Gait assistance: Min assist Gait Distance (Feet): 40 Feet Assistive device: Rolling walker (2 wheeled) Gait Pattern/deviations:  Step-through pattern;Decreased step length - right;Decreased step length - left;Decreased stride length;Drifts right/left;Narrow base of support Gait velocity: decreased   General Gait Details: Pt demonstrated improvement with maintaining path of RW but continues to require VC for completing turns.  Pt required min A for steadying.  Pt continues to require VC to remain within RW during ambulation.   Stairs             Wheelchair Mobility    Modified Rankin (Stroke Patients Only)       Balance Overall balance assessment: Needs assistance Sitting-balance support: Feet supported Sitting balance-Leahy Scale: Good     Standing balance support: Bilateral upper extremity supported;During functional activity Standing balance-Leahy Scale: Poor                              Cognition Arousal/Alertness: Awake/alert Behavior During Therapy: WFL for tasks assessed/performed Overall Cognitive Status: No family/caregiver present to determine baseline cognitive functioning                                 General Comments: A&Ox3 (not oriented to month/year)      Exercises General Exercises - Lower Extremity Long Arc Quad: AROM;Both;Seated(2x10) Hip ABduction/ADduction: AROM;Both;Seated(towel squeeze for adduction; manual resistance for abduction; 2x10 each) Hip Flexion/Marching: AROM;Both;Seated(2x10)    General Comments        Pertinent Vitals/Pain Pain Assessment: Faces Faces Pain Scale: Hurts a little bit Pain Location: L knee Pain Descriptors / Indicators:  Discomfort;Aching;Grimacing Pain Intervention(s): Limited activity within patient's tolerance;Monitored during session    Home Living                      Prior Function            PT Goals (current goals can now be found in the care plan section) Acute Rehab PT Goals Patient Stated Goal: none stated PT Goal Formulation: Patient unable to participate in goal setting Time For  Goal Achievement: 05/24/18 Potential to Achieve Goals: Good Progress towards PT goals: Progressing toward goals    Frequency    Min 2X/week      PT Plan      Co-evaluation              AM-PAC PT "6 Clicks" Mobility   Outcome Measure  Help needed turning from your back to your side while in a flat bed without using bedrails?: A Little Help needed moving from lying on your back to sitting on the side of a flat bed without using bedrails?: A Little Help needed moving to and from a bed to a chair (including a wheelchair)?: A Little Help needed standing up from a chair using your arms (e.g., wheelchair or bedside chair)?: A Little Help needed to walk in hospital room?: A Lot Help needed climbing 3-5 steps with a railing? : A Lot 6 Click Score: 16    End of Session Equipment Utilized During Treatment: Gait belt Activity Tolerance: Patient tolerated treatment well Patient left: in chair;with call bell/phone within reach;with chair alarm set Nurse Communication: Mobility status PT Visit Diagnosis: Unsteadiness on feet (R26.81);History of falling (Z91.81);Difficulty in walking, not elsewhere classified (R26.2);Muscle weakness (generalized) (M62.81)     Time: 9509-3267 PT Time Calculation (min) (ACUTE ONLY): 24 min  Charges:  $Gait Training: 8-22 mins $Therapeutic Exercise: 8-22 mins                     [address] Sharolyn Douglas 05/16/2018, 12:34 PM

## 2018-05-16 NOTE — Clinical Social Work Placement (Signed)
Nurse to call report to [phone], Room 128A     CLINICAL SOCIAL WORK PLACEMENT  NOTE  Date:  05/16/2018  Patient Details  Name: Mario Warner. MRN: [id number] Date of Birth: 03-Nov-1916  Clinical Social Work is seeking post-discharge placement for this patient at the [place] level of care (*CSW will initial, date and re-position this form in  chart as items are completed):      Patient/family provided with [facility] Work Department's list of facilities offering this level of care within the geographic area requested by the patient (or if unable, by the patient's family).      Patient/family informed of their freedom to choose among providers that offer the needed level of care, that participate in Medicare, Medicaid or managed care program needed by the patient, have an available bed and are willing to accept the patient.      Patient/family informed of [facility]'s ownership interest in [facility] and [facility], as well as of the fact that they are under no obligation to receive care at these facilities.  PASRR submitted to EDS on 05/11/18     PASRR number received on 05/11/18     Existing PASRR number confirmed on       FL2 transmitted to all facilities in geographic area requested by pt/family on 05/11/18     FL2 transmitted to all facilities within larger geographic area on       Patient informed that his/her managed care company has contracts with or will negotiate with certain facilities, including the following:        Yes   Patient/family informed of bed offers received.  Patient chooses bed at [facility]     Physician recommends and patient chooses bed at      Patient to be transferred to [facility] on 05/16/18.  Patient to be transferred to facility by [facility]     Patient family notified on 05/16/18 of transfer.  Name of family member notified:  Enid Derry, daughter     PHYSICIAN        Additional Comment:    _______________________________________________ Geralynn Ochs, LCSW 05/16/2018, 11:36 AM

## 2018-05-18 DIAGNOSIS — F05 Delirium due to known physiological condition: Secondary | ICD-10-CM | POA: Diagnosis not present

## 2018-05-18 DIAGNOSIS — W19XXXA Unspecified fall, initial encounter: Secondary | ICD-10-CM | POA: Diagnosis not present

## 2018-05-18 DIAGNOSIS — M6281 Muscle weakness (generalized): Secondary | ICD-10-CM | POA: Diagnosis not present

## 2018-05-25 DIAGNOSIS — R5381 Other malaise: Secondary | ICD-10-CM | POA: Diagnosis not present

## 2018-05-25 DIAGNOSIS — R4189 Other symptoms and signs involving cognitive functions and awareness: Secondary | ICD-10-CM | POA: Diagnosis not present

## 2018-05-25 DIAGNOSIS — M25569 Pain in unspecified knee: Secondary | ICD-10-CM | POA: Diagnosis not present

## 2018-05-25 DIAGNOSIS — R262 Difficulty in walking, not elsewhere classified: Secondary | ICD-10-CM | POA: Diagnosis not present

## 2018-06-02 DIAGNOSIS — N183 Chronic kidney disease, stage 3 (moderate): Secondary | ICD-10-CM | POA: Diagnosis not present

## 2018-06-02 DIAGNOSIS — M6281 Muscle weakness (generalized): Secondary | ICD-10-CM | POA: Diagnosis not present

## 2018-06-02 DIAGNOSIS — G9341 Metabolic encephalopathy: Secondary | ICD-10-CM | POA: Diagnosis not present

## 2018-06-02 DIAGNOSIS — E1129 Type 2 diabetes mellitus with other diabetic kidney complication: Secondary | ICD-10-CM | POA: Diagnosis not present

## 2018-06-02 DIAGNOSIS — Z9181 History of falling: Secondary | ICD-10-CM | POA: Diagnosis not present

## 2018-06-05 DIAGNOSIS — R5381 Other malaise: Secondary | ICD-10-CM | POA: Diagnosis not present

## 2018-06-05 DIAGNOSIS — M25569 Pain in unspecified knee: Secondary | ICD-10-CM | POA: Diagnosis not present

## 2018-06-05 DIAGNOSIS — R262 Difficulty in walking, not elsewhere classified: Secondary | ICD-10-CM | POA: Diagnosis not present

## 2018-06-05 DIAGNOSIS — R4189 Other symptoms and signs involving cognitive functions and awareness: Secondary | ICD-10-CM | POA: Diagnosis not present

## 2018-06-22 DIAGNOSIS — R4189 Other symptoms and signs involving cognitive functions and awareness: Secondary | ICD-10-CM | POA: Diagnosis not present

## 2018-06-22 DIAGNOSIS — R262 Difficulty in walking, not elsewhere classified: Secondary | ICD-10-CM | POA: Diagnosis not present

## 2018-06-22 DIAGNOSIS — M25569 Pain in unspecified knee: Secondary | ICD-10-CM | POA: Diagnosis not present

## 2018-06-22 DIAGNOSIS — R5381 Other malaise: Secondary | ICD-10-CM | POA: Diagnosis not present

## 2018-07-14 DIAGNOSIS — F05 Delirium due to known physiological condition: Secondary | ICD-10-CM | POA: Diagnosis not present

## 2018-07-14 DIAGNOSIS — K5900 Constipation, unspecified: Secondary | ICD-10-CM | POA: Diagnosis not present

## 2018-08-09 DIAGNOSIS — F0390 Unspecified dementia without behavioral disturbance: Secondary | ICD-10-CM | POA: Diagnosis not present

## 2018-08-09 DIAGNOSIS — W19XXXA Unspecified fall, initial encounter: Secondary | ICD-10-CM | POA: Diagnosis not present

## 2018-08-09 DIAGNOSIS — M6281 Muscle weakness (generalized): Secondary | ICD-10-CM | POA: Diagnosis not present

## 2018-08-09 DIAGNOSIS — M79662 Pain in left lower leg: Secondary | ICD-10-CM | POA: Diagnosis not present

## 2018-08-09 DIAGNOSIS — M1712 Unilateral primary osteoarthritis, left knee: Secondary | ICD-10-CM | POA: Diagnosis not present

## 2018-08-11 DIAGNOSIS — G4701 Insomnia due to medical condition: Secondary | ICD-10-CM | POA: Diagnosis not present

## 2018-08-11 DIAGNOSIS — F4323 Adjustment disorder with mixed anxiety and depressed mood: Secondary | ICD-10-CM | POA: Diagnosis not present

## 2018-08-16 DIAGNOSIS — F0390 Unspecified dementia without behavioral disturbance: Secondary | ICD-10-CM | POA: Diagnosis not present

## 2018-08-16 DIAGNOSIS — F05 Delirium due to known physiological condition: Secondary | ICD-10-CM | POA: Diagnosis not present

## 2018-08-16 DIAGNOSIS — R5383 Other fatigue: Secondary | ICD-10-CM | POA: Diagnosis not present

## 2018-08-18 DIAGNOSIS — I10 Essential (primary) hypertension: Secondary | ICD-10-CM | POA: Diagnosis not present

## 2018-08-18 DIAGNOSIS — N189 Chronic kidney disease, unspecified: Secondary | ICD-10-CM | POA: Diagnosis not present

## 2018-08-18 DIAGNOSIS — Z139 Encounter for screening, unspecified: Secondary | ICD-10-CM | POA: Diagnosis not present

## 2018-09-20 DIAGNOSIS — R031 Nonspecific low blood-pressure reading: Secondary | ICD-10-CM | POA: Diagnosis not present

## 2018-09-20 DIAGNOSIS — R4182 Altered mental status, unspecified: Secondary | ICD-10-CM | POA: Diagnosis not present

## 2018-09-20 DIAGNOSIS — Z139 Encounter for screening, unspecified: Secondary | ICD-10-CM | POA: Diagnosis not present

## 2018-09-20 DIAGNOSIS — R55 Syncope and collapse: Secondary | ICD-10-CM | POA: Diagnosis not present

## 2018-09-20 DIAGNOSIS — R5383 Other fatigue: Secondary | ICD-10-CM | POA: Diagnosis not present

## 2018-09-20 DIAGNOSIS — I10 Essential (primary) hypertension: Secondary | ICD-10-CM | POA: Diagnosis not present

## 2018-09-20 DIAGNOSIS — F0390 Unspecified dementia without behavioral disturbance: Secondary | ICD-10-CM | POA: Diagnosis not present

## 2018-09-21 DIAGNOSIS — F0390 Unspecified dementia without behavioral disturbance: Secondary | ICD-10-CM | POA: Diagnosis not present

## 2018-09-21 DIAGNOSIS — R52 Pain, unspecified: Secondary | ICD-10-CM | POA: Diagnosis not present

## 2018-09-21 DIAGNOSIS — R031 Nonspecific low blood-pressure reading: Secondary | ICD-10-CM | POA: Diagnosis not present

## 2018-09-21 DIAGNOSIS — K649 Unspecified hemorrhoids: Secondary | ICD-10-CM | POA: Diagnosis not present

## 2018-09-25 DIAGNOSIS — N189 Chronic kidney disease, unspecified: Secondary | ICD-10-CM | POA: Diagnosis not present

## 2018-09-25 DIAGNOSIS — I10 Essential (primary) hypertension: Secondary | ICD-10-CM | POA: Diagnosis not present

## 2018-09-25 DIAGNOSIS — Z139 Encounter for screening, unspecified: Secondary | ICD-10-CM | POA: Diagnosis not present

## 2018-09-27 DIAGNOSIS — F0390 Unspecified dementia without behavioral disturbance: Secondary | ICD-10-CM | POA: Diagnosis not present

## 2018-09-27 DIAGNOSIS — D649 Anemia, unspecified: Secondary | ICD-10-CM | POA: Diagnosis not present

## 2018-09-27 DIAGNOSIS — R031 Nonspecific low blood-pressure reading: Secondary | ICD-10-CM | POA: Diagnosis not present

## 2018-09-27 DIAGNOSIS — R5383 Other fatigue: Secondary | ICD-10-CM | POA: Diagnosis not present

## 2018-09-27 DIAGNOSIS — I10 Essential (primary) hypertension: Secondary | ICD-10-CM | POA: Diagnosis not present

## 2018-10-04 DIAGNOSIS — N189 Chronic kidney disease, unspecified: Secondary | ICD-10-CM | POA: Diagnosis not present

## 2018-10-04 DIAGNOSIS — Z139 Encounter for screening, unspecified: Secondary | ICD-10-CM | POA: Diagnosis not present

## 2018-10-04 DIAGNOSIS — I10 Essential (primary) hypertension: Secondary | ICD-10-CM | POA: Diagnosis not present

## 2018-10-13 DIAGNOSIS — R5381 Other malaise: Secondary | ICD-10-CM | POA: Diagnosis not present

## 2018-10-13 DIAGNOSIS — M6281 Muscle weakness (generalized): Secondary | ICD-10-CM | POA: Diagnosis not present

## 2018-10-13 DIAGNOSIS — R112 Nausea with vomiting, unspecified: Secondary | ICD-10-CM | POA: Diagnosis not present

## 2018-10-13 DIAGNOSIS — R54 Age-related physical debility: Secondary | ICD-10-CM | POA: Diagnosis not present

## 2018-10-16 DIAGNOSIS — F0390 Unspecified dementia without behavioral disturbance: Secondary | ICD-10-CM | POA: Diagnosis not present

## 2018-10-16 DIAGNOSIS — F05 Delirium due to known physiological condition: Secondary | ICD-10-CM | POA: Diagnosis not present

## 2018-10-16 DIAGNOSIS — M6281 Muscle weakness (generalized): Secondary | ICD-10-CM | POA: Diagnosis not present

## 2018-10-16 DIAGNOSIS — W19XXXA Unspecified fall, initial encounter: Secondary | ICD-10-CM | POA: Diagnosis not present

## 2018-11-23 DIAGNOSIS — N189 Chronic kidney disease, unspecified: Secondary | ICD-10-CM | POA: Diagnosis not present

## 2018-11-23 DIAGNOSIS — Z139 Encounter for screening, unspecified: Secondary | ICD-10-CM | POA: Diagnosis not present

## 2018-11-23 DIAGNOSIS — N390 Urinary tract infection, site not specified: Secondary | ICD-10-CM | POA: Diagnosis not present

## 2018-11-23 DIAGNOSIS — I10 Essential (primary) hypertension: Secondary | ICD-10-CM | POA: Diagnosis not present

## 2018-11-24 DIAGNOSIS — W19XXXA Unspecified fall, initial encounter: Secondary | ICD-10-CM | POA: Diagnosis not present

## 2018-11-24 DIAGNOSIS — R410 Disorientation, unspecified: Secondary | ICD-10-CM | POA: Diagnosis not present

## 2018-11-24 DIAGNOSIS — M6281 Muscle weakness (generalized): Secondary | ICD-10-CM | POA: Diagnosis not present

## 2018-11-24 DIAGNOSIS — F0390 Unspecified dementia without behavioral disturbance: Secondary | ICD-10-CM | POA: Diagnosis not present

## 2018-12-17 DIAGNOSIS — M25522 Pain in left elbow: Secondary | ICD-10-CM | POA: Diagnosis not present

## 2018-12-19 DIAGNOSIS — R600 Localized edema: Secondary | ICD-10-CM | POA: Diagnosis not present

## 2018-12-19 DIAGNOSIS — M25522 Pain in left elbow: Secondary | ICD-10-CM | POA: Diagnosis not present

## 2019-01-17 DIAGNOSIS — Z20828 Contact with and (suspected) exposure to other viral communicable diseases: Secondary | ICD-10-CM | POA: Diagnosis not present

## 2019-01-22 DIAGNOSIS — Z139 Encounter for screening, unspecified: Secondary | ICD-10-CM | POA: Diagnosis not present

## 2019-03-13 DIAGNOSIS — E1129 Type 2 diabetes mellitus with other diabetic kidney complication: Secondary | ICD-10-CM | POA: Diagnosis not present

## 2019-03-13 DIAGNOSIS — F0390 Unspecified dementia without behavioral disturbance: Secondary | ICD-10-CM | POA: Diagnosis not present

## 2019-03-13 DIAGNOSIS — N400 Enlarged prostate without lower urinary tract symptoms: Secondary | ICD-10-CM | POA: Diagnosis not present

## 2019-03-13 DIAGNOSIS — M109 Gout, unspecified: Secondary | ICD-10-CM | POA: Diagnosis not present

## 2019-03-13 DIAGNOSIS — M545 Low back pain: Secondary | ICD-10-CM | POA: Diagnosis not present

## 2019-03-13 DIAGNOSIS — I2510 Atherosclerotic heart disease of native coronary artery without angina pectoris: Secondary | ICD-10-CM | POA: Diagnosis not present

## 2019-03-13 DIAGNOSIS — M6281 Muscle weakness (generalized): Secondary | ICD-10-CM | POA: Diagnosis not present

## 2019-03-13 DIAGNOSIS — I4892 Unspecified atrial flutter: Secondary | ICD-10-CM | POA: Diagnosis not present

## 2019-03-13 DIAGNOSIS — I10 Essential (primary) hypertension: Secondary | ICD-10-CM | POA: Diagnosis not present

## 2019-03-14 DIAGNOSIS — E1129 Type 2 diabetes mellitus with other diabetic kidney complication: Secondary | ICD-10-CM | POA: Diagnosis not present

## 2019-03-14 DIAGNOSIS — Z8546 Personal history of malignant neoplasm of prostate: Secondary | ICD-10-CM | POA: Diagnosis not present

## 2019-03-14 DIAGNOSIS — R5383 Other fatigue: Secondary | ICD-10-CM | POA: Diagnosis not present

## 2019-03-14 DIAGNOSIS — I10 Essential (primary) hypertension: Secondary | ICD-10-CM | POA: Diagnosis not present

## 2019-03-14 DIAGNOSIS — E785 Hyperlipidemia, unspecified: Secondary | ICD-10-CM | POA: Diagnosis not present

## 2019-03-14 DIAGNOSIS — I4892 Unspecified atrial flutter: Secondary | ICD-10-CM | POA: Diagnosis not present

## 2019-03-14 DIAGNOSIS — E119 Type 2 diabetes mellitus without complications: Secondary | ICD-10-CM | POA: Diagnosis not present

## 2019-03-14 DIAGNOSIS — E039 Hypothyroidism, unspecified: Secondary | ICD-10-CM | POA: Diagnosis not present

## 2019-03-14 DIAGNOSIS — H409 Unspecified glaucoma: Secondary | ICD-10-CM | POA: Diagnosis not present

## 2019-04-23 DIAGNOSIS — W19XXXA Unspecified fall, initial encounter: Secondary | ICD-10-CM | POA: Diagnosis not present

## 2019-04-23 DIAGNOSIS — F0390 Unspecified dementia without behavioral disturbance: Secondary | ICD-10-CM | POA: Diagnosis not present

## 2019-04-23 DIAGNOSIS — M6281 Muscle weakness (generalized): Secondary | ICD-10-CM | POA: Diagnosis not present

## 2019-04-25 DIAGNOSIS — F0390 Unspecified dementia without behavioral disturbance: Secondary | ICD-10-CM | POA: Diagnosis not present

## 2019-04-25 DIAGNOSIS — Z23 Encounter for immunization: Secondary | ICD-10-CM | POA: Diagnosis not present

## 2019-06-12 DIAGNOSIS — G4700 Insomnia, unspecified: Secondary | ICD-10-CM | POA: Diagnosis not present

## 2019-06-12 DIAGNOSIS — F0390 Unspecified dementia without behavioral disturbance: Secondary | ICD-10-CM | POA: Diagnosis not present

## 2019-06-12 DIAGNOSIS — R451 Restlessness and agitation: Secondary | ICD-10-CM | POA: Diagnosis not present

## 2019-06-12 DIAGNOSIS — F411 Generalized anxiety disorder: Secondary | ICD-10-CM | POA: Diagnosis not present

## 2019-07-03 DIAGNOSIS — Z20828 Contact with and (suspected) exposure to other viral communicable diseases: Secondary | ICD-10-CM | POA: Diagnosis not present

## 2019-07-09 DIAGNOSIS — Z20828 Contact with and (suspected) exposure to other viral communicable diseases: Secondary | ICD-10-CM | POA: Diagnosis not present

## 2019-07-16 DIAGNOSIS — Z20828 Contact with and (suspected) exposure to other viral communicable diseases: Secondary | ICD-10-CM | POA: Diagnosis not present

## 2019-07-17 DIAGNOSIS — N400 Enlarged prostate without lower urinary tract symptoms: Secondary | ICD-10-CM | POA: Diagnosis not present

## 2019-07-17 DIAGNOSIS — E1129 Type 2 diabetes mellitus with other diabetic kidney complication: Secondary | ICD-10-CM | POA: Diagnosis not present

## 2019-07-17 DIAGNOSIS — E785 Hyperlipidemia, unspecified: Secondary | ICD-10-CM | POA: Diagnosis not present

## 2019-07-17 DIAGNOSIS — I4892 Unspecified atrial flutter: Secondary | ICD-10-CM | POA: Diagnosis not present

## 2019-07-17 DIAGNOSIS — F0390 Unspecified dementia without behavioral disturbance: Secondary | ICD-10-CM | POA: Diagnosis not present

## 2019-07-17 DIAGNOSIS — Z950 Presence of cardiac pacemaker: Secondary | ICD-10-CM | POA: Diagnosis not present

## 2019-07-17 DIAGNOSIS — I2510 Atherosclerotic heart disease of native coronary artery without angina pectoris: Secondary | ICD-10-CM | POA: Diagnosis not present

## 2019-07-17 DIAGNOSIS — N1831 Chronic kidney disease, stage 3a: Secondary | ICD-10-CM | POA: Diagnosis not present

## 2019-07-17 DIAGNOSIS — H409 Unspecified glaucoma: Secondary | ICD-10-CM | POA: Diagnosis not present

## 2019-07-18 DIAGNOSIS — Z139 Encounter for screening, unspecified: Secondary | ICD-10-CM | POA: Diagnosis not present

## 2019-07-23 DIAGNOSIS — Z20828 Contact with and (suspected) exposure to other viral communicable diseases: Secondary | ICD-10-CM | POA: Diagnosis not present

## 2019-07-24 DIAGNOSIS — R5383 Other fatigue: Secondary | ICD-10-CM | POA: Diagnosis not present

## 2019-07-24 DIAGNOSIS — F0390 Unspecified dementia without behavioral disturbance: Secondary | ICD-10-CM | POA: Diagnosis not present

## 2019-07-24 DIAGNOSIS — E1129 Type 2 diabetes mellitus with other diabetic kidney complication: Secondary | ICD-10-CM | POA: Diagnosis not present

## 2019-07-24 DIAGNOSIS — E559 Vitamin D deficiency, unspecified: Secondary | ICD-10-CM | POA: Diagnosis not present

## 2019-07-30 DIAGNOSIS — Z20828 Contact with and (suspected) exposure to other viral communicable diseases: Secondary | ICD-10-CM | POA: Diagnosis not present

## 2019-08-07 DIAGNOSIS — Z20828 Contact with and (suspected) exposure to other viral communicable diseases: Secondary | ICD-10-CM | POA: Diagnosis not present

## 2019-08-14 DIAGNOSIS — R1312 Dysphagia, oropharyngeal phase: Secondary | ICD-10-CM | POA: Diagnosis not present

## 2019-08-14 DIAGNOSIS — M6281 Muscle weakness (generalized): Secondary | ICD-10-CM | POA: Diagnosis not present

## 2019-08-14 DIAGNOSIS — N183 Chronic kidney disease, stage 3 unspecified: Secondary | ICD-10-CM | POA: Diagnosis not present

## 2019-08-14 DIAGNOSIS — F0390 Unspecified dementia without behavioral disturbance: Secondary | ICD-10-CM | POA: Diagnosis not present

## 2019-08-20 DIAGNOSIS — F0390 Unspecified dementia without behavioral disturbance: Secondary | ICD-10-CM | POA: Diagnosis not present

## 2019-08-20 DIAGNOSIS — M6281 Muscle weakness (generalized): Secondary | ICD-10-CM | POA: Diagnosis not present

## 2019-08-20 DIAGNOSIS — N183 Chronic kidney disease, stage 3 unspecified: Secondary | ICD-10-CM | POA: Diagnosis not present

## 2019-08-20 DIAGNOSIS — R1312 Dysphagia, oropharyngeal phase: Secondary | ICD-10-CM | POA: Diagnosis not present

## 2019-08-21 DIAGNOSIS — F0390 Unspecified dementia without behavioral disturbance: Secondary | ICD-10-CM | POA: Diagnosis not present

## 2019-08-21 DIAGNOSIS — N183 Chronic kidney disease, stage 3 unspecified: Secondary | ICD-10-CM | POA: Diagnosis not present

## 2019-08-21 DIAGNOSIS — R1312 Dysphagia, oropharyngeal phase: Secondary | ICD-10-CM | POA: Diagnosis not present

## 2019-08-21 DIAGNOSIS — M6281 Muscle weakness (generalized): Secondary | ICD-10-CM | POA: Diagnosis not present

## 2019-08-22 DIAGNOSIS — F0390 Unspecified dementia without behavioral disturbance: Secondary | ICD-10-CM | POA: Diagnosis not present

## 2019-08-22 DIAGNOSIS — N183 Chronic kidney disease, stage 3 unspecified: Secondary | ICD-10-CM | POA: Diagnosis not present

## 2019-08-22 DIAGNOSIS — M6281 Muscle weakness (generalized): Secondary | ICD-10-CM | POA: Diagnosis not present

## 2019-08-22 DIAGNOSIS — R1312 Dysphagia, oropharyngeal phase: Secondary | ICD-10-CM | POA: Diagnosis not present

## 2019-08-23 DIAGNOSIS — N183 Chronic kidney disease, stage 3 unspecified: Secondary | ICD-10-CM | POA: Diagnosis not present

## 2019-08-23 DIAGNOSIS — M6281 Muscle weakness (generalized): Secondary | ICD-10-CM | POA: Diagnosis not present

## 2019-08-23 DIAGNOSIS — R627 Adult failure to thrive: Secondary | ICD-10-CM | POA: Diagnosis not present

## 2019-08-23 DIAGNOSIS — R630 Anorexia: Secondary | ICD-10-CM | POA: Diagnosis not present

## 2019-08-23 DIAGNOSIS — F0390 Unspecified dementia without behavioral disturbance: Secondary | ICD-10-CM | POA: Diagnosis not present

## 2019-08-23 DIAGNOSIS — R1312 Dysphagia, oropharyngeal phase: Secondary | ICD-10-CM | POA: Diagnosis not present

## 2019-08-23 DIAGNOSIS — R634 Abnormal weight loss: Secondary | ICD-10-CM | POA: Diagnosis not present

## 2019-08-24 DIAGNOSIS — F0390 Unspecified dementia without behavioral disturbance: Secondary | ICD-10-CM | POA: Diagnosis not present

## 2019-08-24 DIAGNOSIS — N183 Chronic kidney disease, stage 3 unspecified: Secondary | ICD-10-CM | POA: Diagnosis not present

## 2019-08-24 DIAGNOSIS — R1312 Dysphagia, oropharyngeal phase: Secondary | ICD-10-CM | POA: Diagnosis not present

## 2019-08-24 DIAGNOSIS — M6281 Muscle weakness (generalized): Secondary | ICD-10-CM | POA: Diagnosis not present

## 2019-08-25 ENCOUNTER — Encounter: Payer: Self-pay | Admitting: Cardiology

## 2019-08-27 DIAGNOSIS — F0390 Unspecified dementia without behavioral disturbance: Secondary | ICD-10-CM | POA: Diagnosis not present

## 2019-08-27 DIAGNOSIS — M6281 Muscle weakness (generalized): Secondary | ICD-10-CM | POA: Diagnosis not present

## 2019-08-27 DIAGNOSIS — R1312 Dysphagia, oropharyngeal phase: Secondary | ICD-10-CM | POA: Diagnosis not present

## 2019-08-27 DIAGNOSIS — N183 Chronic kidney disease, stage 3 unspecified: Secondary | ICD-10-CM | POA: Diagnosis not present

## 2019-08-31 DIAGNOSIS — R1312 Dysphagia, oropharyngeal phase: Secondary | ICD-10-CM | POA: Diagnosis not present

## 2019-08-31 DIAGNOSIS — N183 Chronic kidney disease, stage 3 unspecified: Secondary | ICD-10-CM | POA: Diagnosis not present

## 2019-08-31 DIAGNOSIS — F0390 Unspecified dementia without behavioral disturbance: Secondary | ICD-10-CM | POA: Diagnosis not present

## 2019-08-31 DIAGNOSIS — M6281 Muscle weakness (generalized): Secondary | ICD-10-CM | POA: Diagnosis not present

## 2019-09-05 DIAGNOSIS — R1312 Dysphagia, oropharyngeal phase: Secondary | ICD-10-CM | POA: Diagnosis not present

## 2019-09-05 DIAGNOSIS — M6281 Muscle weakness (generalized): Secondary | ICD-10-CM | POA: Diagnosis not present

## 2019-09-05 DIAGNOSIS — N183 Chronic kidney disease, stage 3 unspecified: Secondary | ICD-10-CM | POA: Diagnosis not present

## 2019-09-05 DIAGNOSIS — F0390 Unspecified dementia without behavioral disturbance: Secondary | ICD-10-CM | POA: Diagnosis not present

## 2019-09-06 DIAGNOSIS — N183 Chronic kidney disease, stage 3 unspecified: Secondary | ICD-10-CM | POA: Diagnosis not present

## 2019-09-06 DIAGNOSIS — M6281 Muscle weakness (generalized): Secondary | ICD-10-CM | POA: Diagnosis not present

## 2019-09-06 DIAGNOSIS — F0390 Unspecified dementia without behavioral disturbance: Secondary | ICD-10-CM | POA: Diagnosis not present

## 2019-09-06 DIAGNOSIS — R1312 Dysphagia, oropharyngeal phase: Secondary | ICD-10-CM | POA: Diagnosis not present

## 2019-09-07 DIAGNOSIS — M6281 Muscle weakness (generalized): Secondary | ICD-10-CM | POA: Diagnosis not present

## 2019-09-07 DIAGNOSIS — N183 Chronic kidney disease, stage 3 unspecified: Secondary | ICD-10-CM | POA: Diagnosis not present

## 2019-09-07 DIAGNOSIS — R1312 Dysphagia, oropharyngeal phase: Secondary | ICD-10-CM | POA: Diagnosis not present

## 2019-09-07 DIAGNOSIS — F0390 Unspecified dementia without behavioral disturbance: Secondary | ICD-10-CM | POA: Diagnosis not present

## 2019-09-10 DIAGNOSIS — N183 Chronic kidney disease, stage 3 unspecified: Secondary | ICD-10-CM | POA: Diagnosis not present

## 2019-09-10 DIAGNOSIS — M6281 Muscle weakness (generalized): Secondary | ICD-10-CM | POA: Diagnosis not present

## 2019-09-10 DIAGNOSIS — F0390 Unspecified dementia without behavioral disturbance: Secondary | ICD-10-CM | POA: Diagnosis not present

## 2019-09-10 DIAGNOSIS — R1312 Dysphagia, oropharyngeal phase: Secondary | ICD-10-CM | POA: Diagnosis not present

## 2019-09-11 DIAGNOSIS — R1312 Dysphagia, oropharyngeal phase: Secondary | ICD-10-CM | POA: Diagnosis not present

## 2019-09-11 DIAGNOSIS — N183 Chronic kidney disease, stage 3 unspecified: Secondary | ICD-10-CM | POA: Diagnosis not present

## 2019-09-11 DIAGNOSIS — M6281 Muscle weakness (generalized): Secondary | ICD-10-CM | POA: Diagnosis not present

## 2019-09-11 DIAGNOSIS — F0390 Unspecified dementia without behavioral disturbance: Secondary | ICD-10-CM | POA: Diagnosis not present

## 2019-09-12 DIAGNOSIS — N183 Chronic kidney disease, stage 3 unspecified: Secondary | ICD-10-CM | POA: Diagnosis not present

## 2019-09-12 DIAGNOSIS — R1312 Dysphagia, oropharyngeal phase: Secondary | ICD-10-CM | POA: Diagnosis not present

## 2019-09-12 DIAGNOSIS — M6281 Muscle weakness (generalized): Secondary | ICD-10-CM | POA: Diagnosis not present

## 2019-09-12 DIAGNOSIS — F0390 Unspecified dementia without behavioral disturbance: Secondary | ICD-10-CM | POA: Diagnosis not present

## 2019-09-13 DIAGNOSIS — F0390 Unspecified dementia without behavioral disturbance: Secondary | ICD-10-CM | POA: Diagnosis not present

## 2019-09-13 DIAGNOSIS — R1312 Dysphagia, oropharyngeal phase: Secondary | ICD-10-CM | POA: Diagnosis not present

## 2019-09-18 DIAGNOSIS — Z20828 Contact with and (suspected) exposure to other viral communicable diseases: Secondary | ICD-10-CM | POA: Diagnosis not present

## 2019-09-25 DIAGNOSIS — E43 Unspecified severe protein-calorie malnutrition: Secondary | ICD-10-CM | POA: Diagnosis not present

## 2019-09-25 DIAGNOSIS — R1312 Dysphagia, oropharyngeal phase: Secondary | ICD-10-CM | POA: Diagnosis not present

## 2019-09-25 DIAGNOSIS — R634 Abnormal weight loss: Secondary | ICD-10-CM | POA: Diagnosis not present

## 2019-09-25 DIAGNOSIS — F0390 Unspecified dementia without behavioral disturbance: Secondary | ICD-10-CM | POA: Diagnosis not present

## 2019-09-25 DIAGNOSIS — Z20828 Contact with and (suspected) exposure to other viral communicable diseases: Secondary | ICD-10-CM | POA: Diagnosis not present

## 2019-09-25 DIAGNOSIS — R630 Anorexia: Secondary | ICD-10-CM | POA: Diagnosis not present

## 2019-09-25 DIAGNOSIS — R627 Adult failure to thrive: Secondary | ICD-10-CM | POA: Diagnosis not present

## 2019-10-10 DIAGNOSIS — R197 Diarrhea, unspecified: Secondary | ICD-10-CM | POA: Diagnosis not present

## 2019-10-10 DIAGNOSIS — F0390 Unspecified dementia without behavioral disturbance: Secondary | ICD-10-CM | POA: Diagnosis not present

## 2019-10-18 DIAGNOSIS — F0390 Unspecified dementia without behavioral disturbance: Secondary | ICD-10-CM | POA: Diagnosis not present

## 2019-10-18 DIAGNOSIS — R031 Nonspecific low blood-pressure reading: Secondary | ICD-10-CM | POA: Diagnosis not present

## 2019-10-18 DIAGNOSIS — I10 Essential (primary) hypertension: Secondary | ICD-10-CM | POA: Diagnosis not present

## 2019-10-18 DIAGNOSIS — R634 Abnormal weight loss: Secondary | ICD-10-CM | POA: Diagnosis not present

## 2019-11-13 DEATH — deceased

## 2019-11-30 IMAGING — CT CT L SPINE W/O CM
3 of 5 series · 12 of 35 positions shown, 14 images · non-contrast
Comparison: CT abdomen pelvis 01/27/2018

CLINICAL DATA: Fall today.  Back pain.  History of prostate cancer.

EXAM:
CT LUMBAR SPINE WITHOUT CONTRAST
TECHNIQUE: Multidetector CT imaging of the lumbar spine was performed without
intravenous contrast administration. Multiplanar CT image
reconstructions were also generated.

[Series 5: l spine soft · axial · 0.34mm/px · z∈[-763,-567]mm · 5 of 142 slices shown, 7 images]
[im 22/142  soft-tissue]
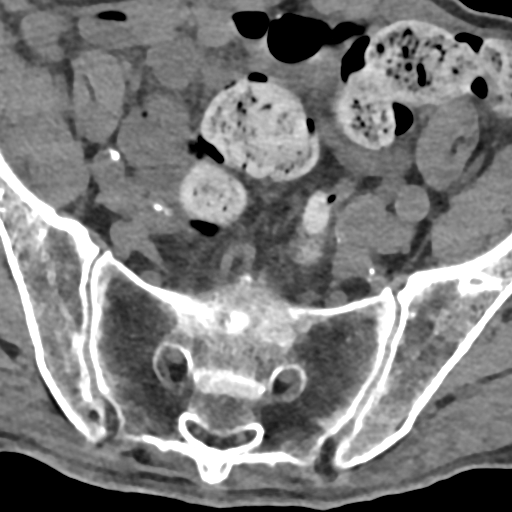
[im 22/142  bone]
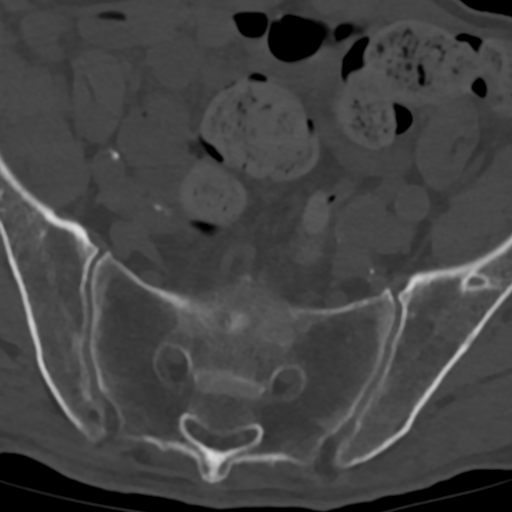
[im 44/142  bone]
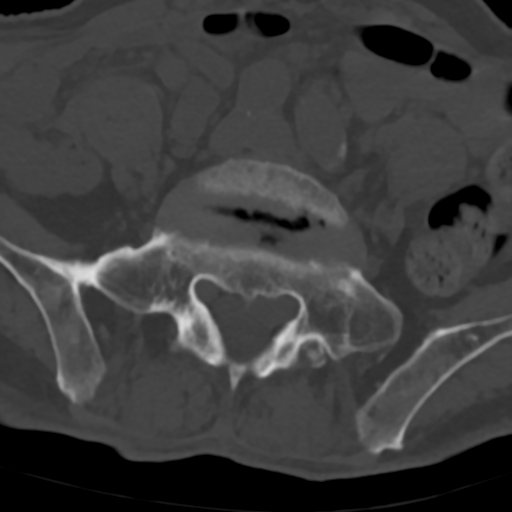
[im 76/142  bone]
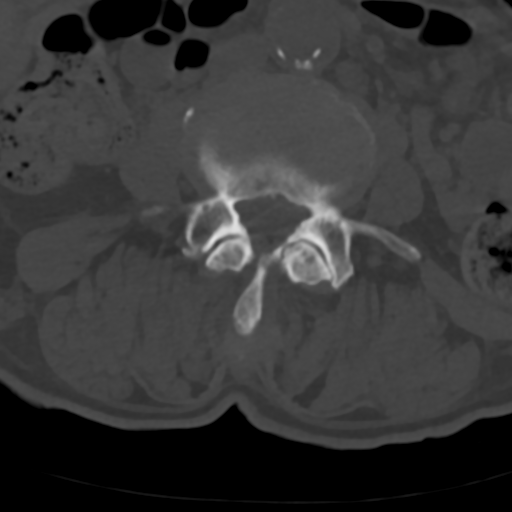
[im 98/142  bone]
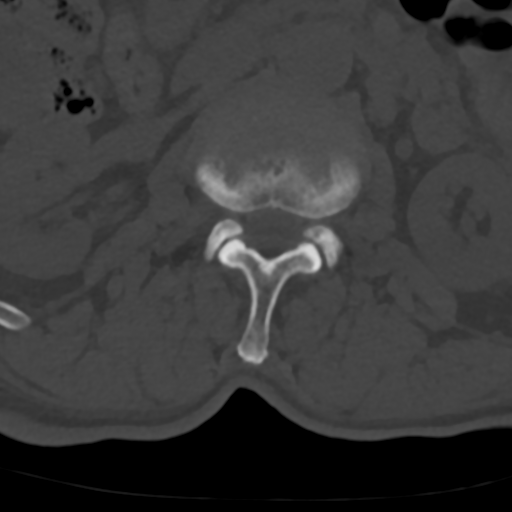
[im 120/142  soft-tissue]
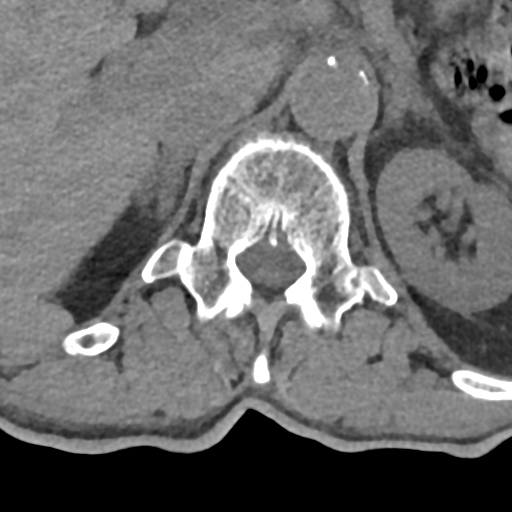
[im 120/142  bone]
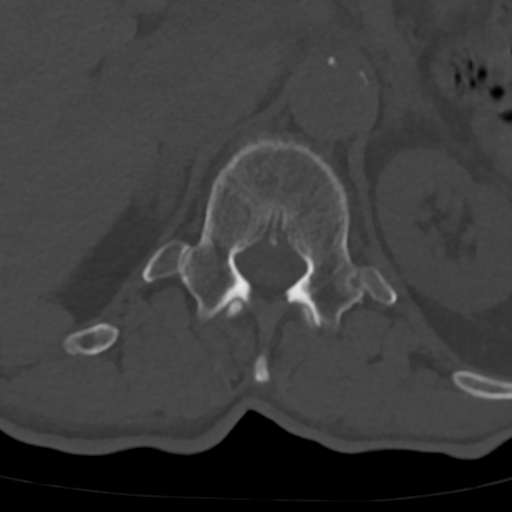

[Series 8: cor bone · coronal · 0.36mm/px · 1 of 84 slices shown]
[im 42/84  bone]
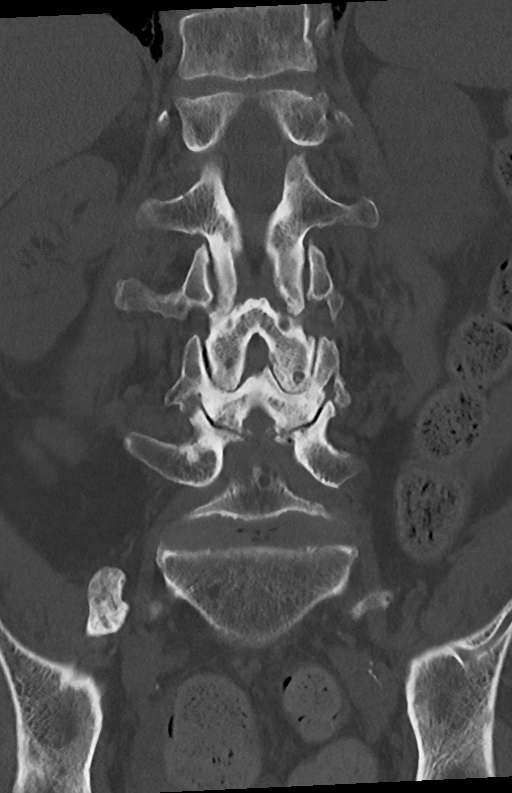

[Series 10: sag st · sagittal · 0.34mm/px · 6 of 107 slices shown]
[im 18/107  bone]
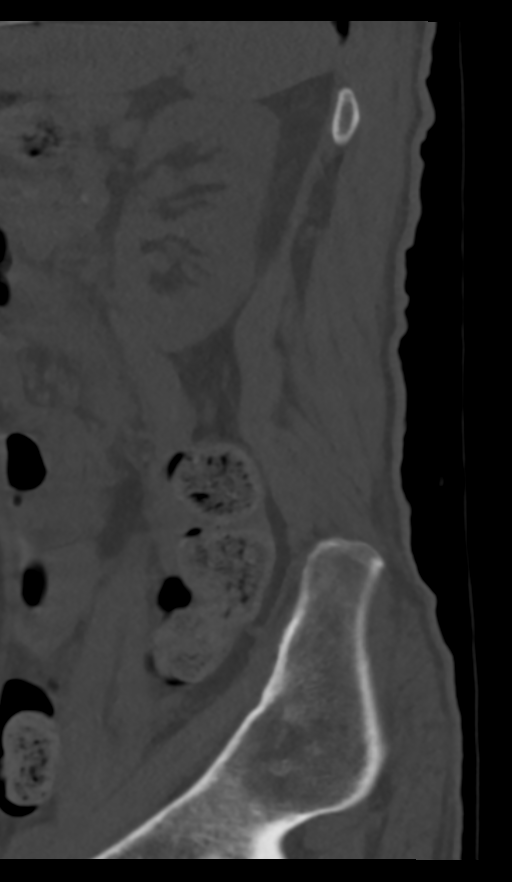
[im 36/107  bone]
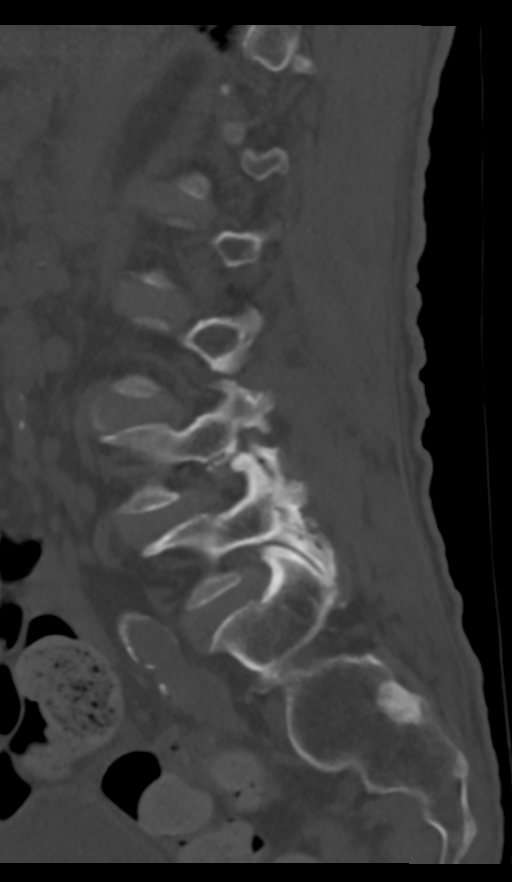
[im 54/107  bone]
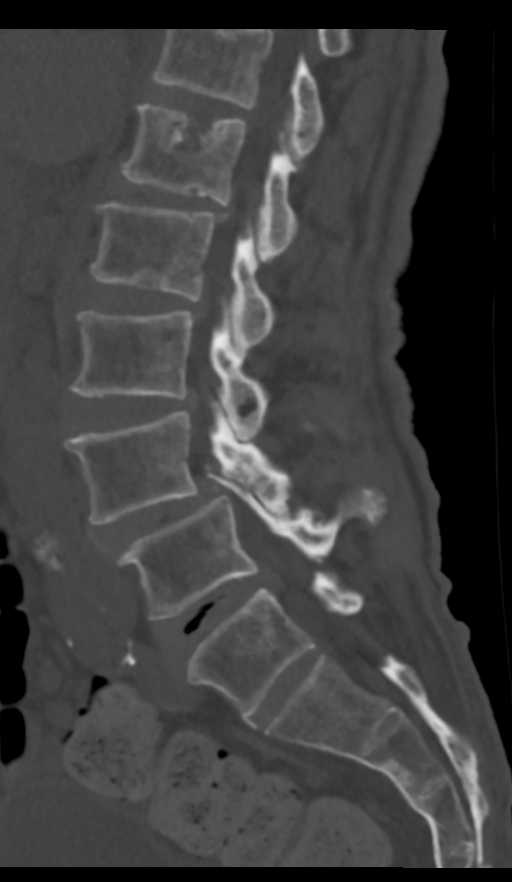
[im 71/107  bone]
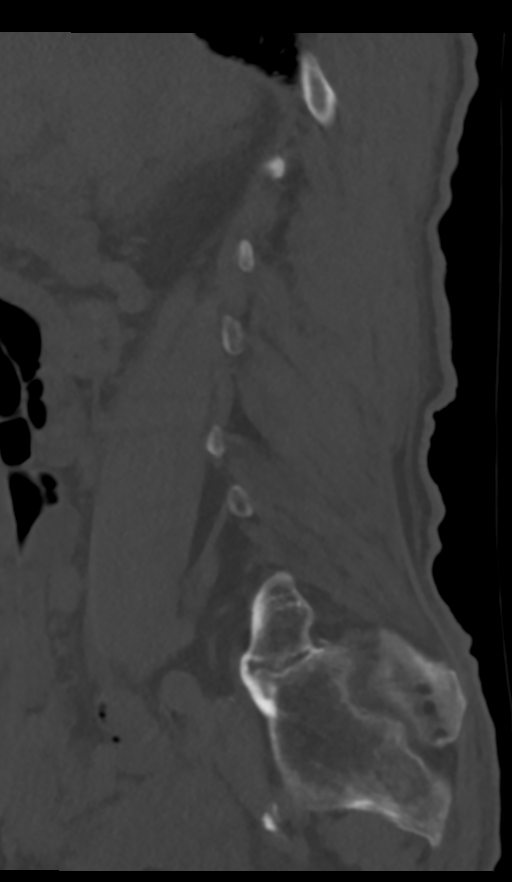
[im 89/107  bone]
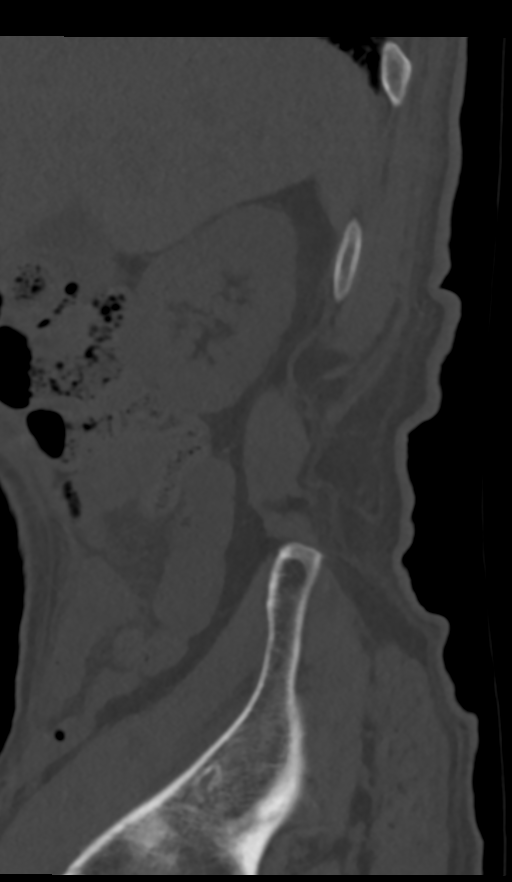
[im 98/107  soft-tissue]
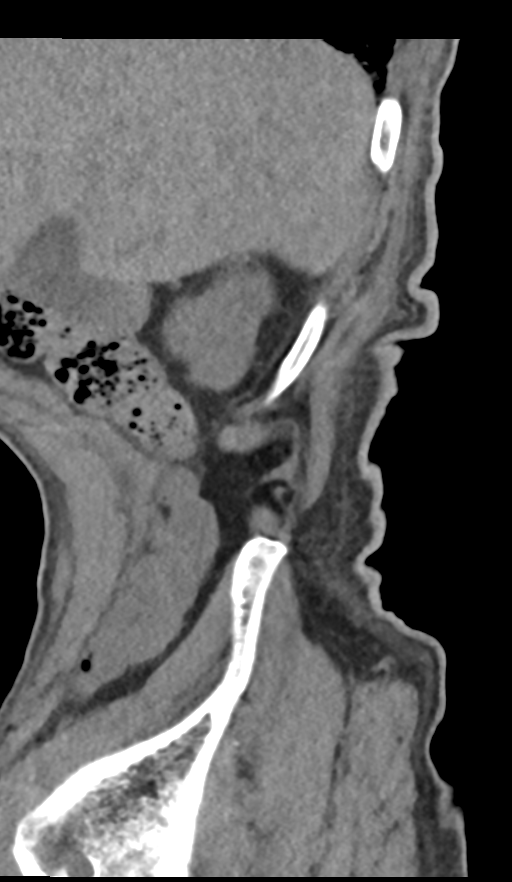

[12 of 35 positions shown; findings below may reference images not displayed]

FINDINGS: Segmentation: Normal segmentation.

Small ribs at T12.  S1 is partially sacralized.

Alignment: 8 mm anterolisthesis L3-4.  3 mm retrolisthesis L4-5.

Vertebrae: 2.3 cm sclerotic lesion L1 vertebral body on the left is
increased in size since the prior CT abdomen pelvis and is
consistent with metastatic disease. Sclerotic lesion right medial
iliac bone incompletely evaluated but was present previously.

Negative for lumbar fracture.

Paraspinal and other soft tissues: Atherosclerotic aorta and iliacs
without aneurysm. No retroperitoneal adenopathy.

Disc levels: T11-12: Schmorl's node superior endplate of T12. Mild
disc and facet degeneration without significant stenosis

T12-L1: Broad-based disc protrusion. Bilateral facet degeneration.
Moderate subarticular and foraminal stenosis bilaterally. Spinal
canal adequate in size.

L1-2: Moderate disc bulging. Moderate facet hypertrophy. Mild spinal
stenosis. Mild subarticular stenosis bilaterally

L2-3: Diffuse bulging of the disc. Moderate facet hypertrophy.
Moderate spinal stenosis. Moderate subarticular and foraminal
stenosis bilaterally

L3-4: 8 mm anterolisthesis. Diffuse disc bulging. Severe facet
hypertrophy. Probable laminectomy at this level in the past. Severe
spinal stenosis. Severe subarticular and foraminal stenosis
bilaterally with impingement of the L3 and L4 nerve roots
bilaterally.

L4-5: 3 mm retrolisthesis. Diffuse bulging of the disc. Moderate
facet hypertrophy. Moderate spinal stenosis. Moderate subarticular
stenosis bilaterally

L5-S1: L5 transverse processes articulate with the sacrum. No
significant degenerative change.
IMPRESSION: 1. Sclerotic lesion L1 vertebral body with progression since CT of
01/27/2018 compatible with progressive prostate cancer metastasis.
Sclerotic lesion right medial iliac bone also consistent with
metastatic disease although incompletely evaluated on the study.
2. Extensive multilevel lumbar degenerative change as above.
Multilevel spinal and subarticular stenosis, most severe at L2-3,
L3-4, L4-5.

## 2019-11-30 IMAGING — CT CT CERVICAL SPINE W/O CM
5 of 8 series · 11 of 33 positions shown, 12 images · non-contrast
Comparison: 12/26/2011 head CT.  No comparison cervical spine CT.

CLINICAL DATA: [AGE] male post fall.  Initial encounter.

EXAM:
CT HEAD WITHOUT CONTRAST
CT CERVICAL SPINE WITHOUT CONTRAST
TECHNIQUE: Multidetector CT imaging of the head and cervical spine was
performed following the standard protocol without intravenous
contrast. Multiplanar CT image reconstructions of the cervical spine
were also generated.

[Series 4: head bone · axial · 0.45mm/px · z∈[-114,-54]mm · 2 of 92 slices shown]
[im 31/92  bone]
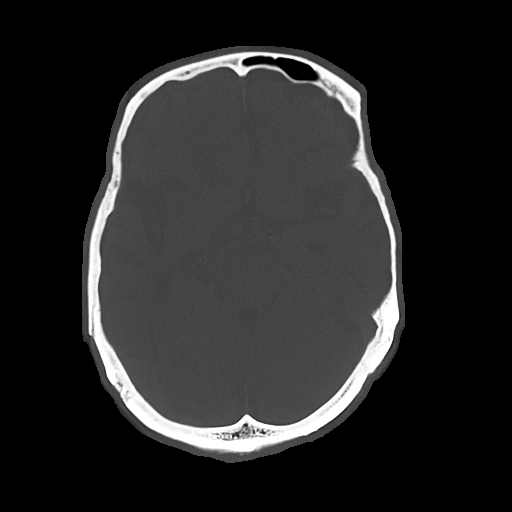
[im 61/92  bone]
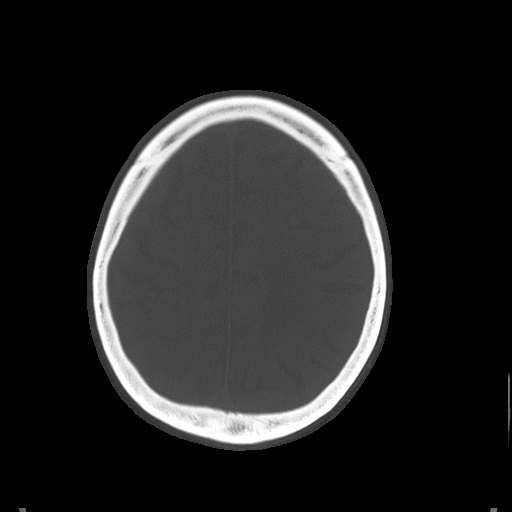

[Series 9: c spine soft · axial · 0.37mm/px · z∈[-258,-190]mm · 2 of 103 slices shown]
[im 35/103  soft-tissue]
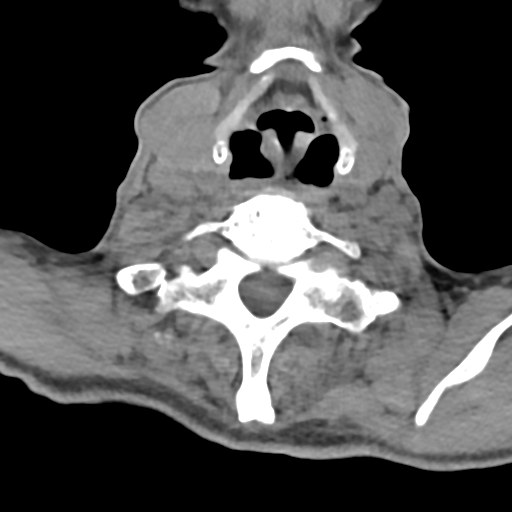
[im 69/103  soft-tissue]
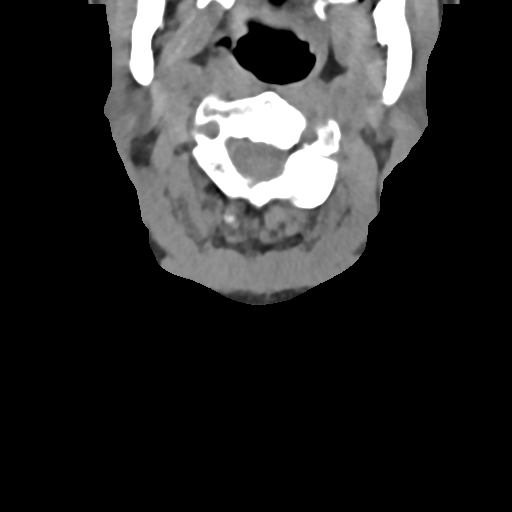

[Series 10: sag bone · sagittal · 0.30mm/px · 4 of 61 slices shown]
[im 13/61  bone]
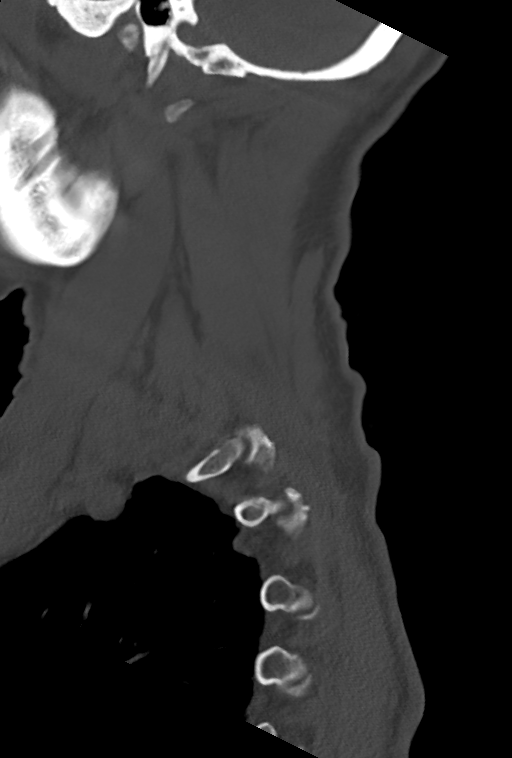
[im 25/61  bone]
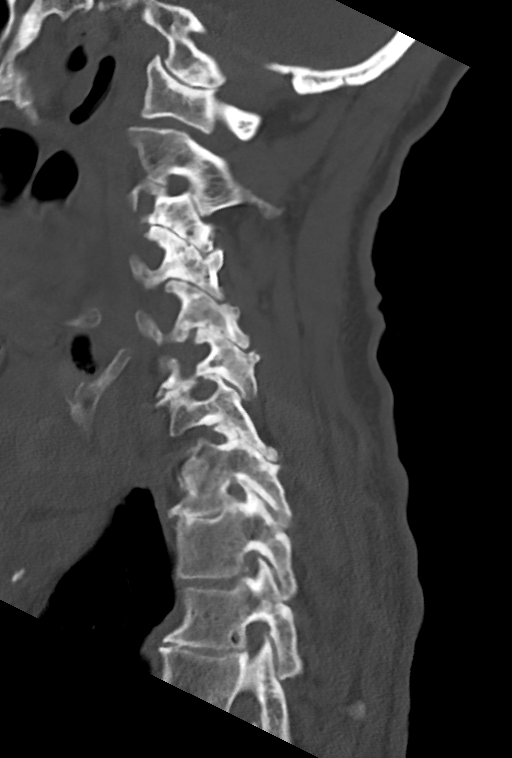
[im 37/61  bone]
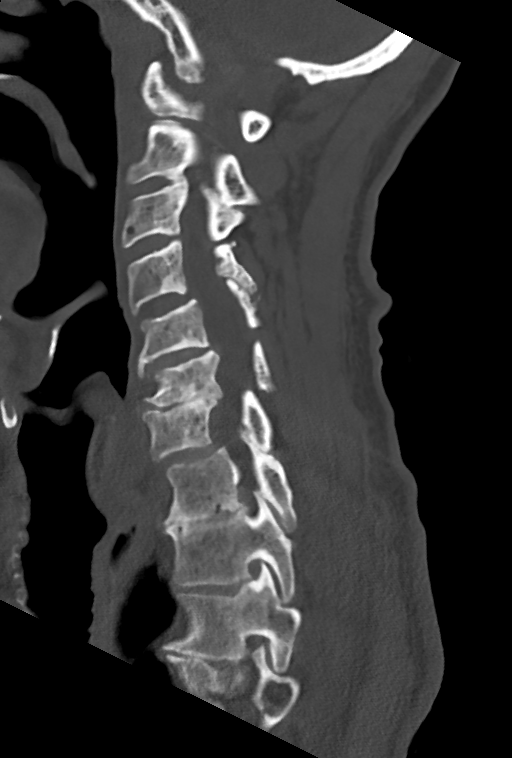
[im 49/61  bone]
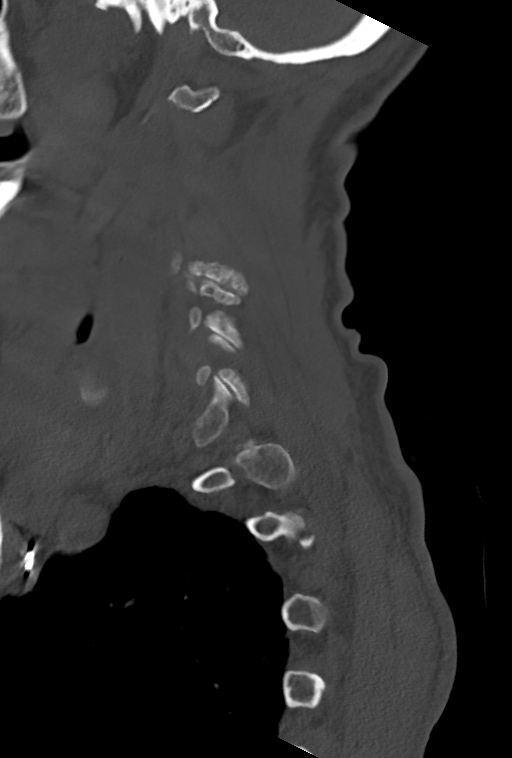

[Series 11: cor bone · coronal · 0.30mm/px · 1 of 61 slices shown]
[im 31/61  bone]
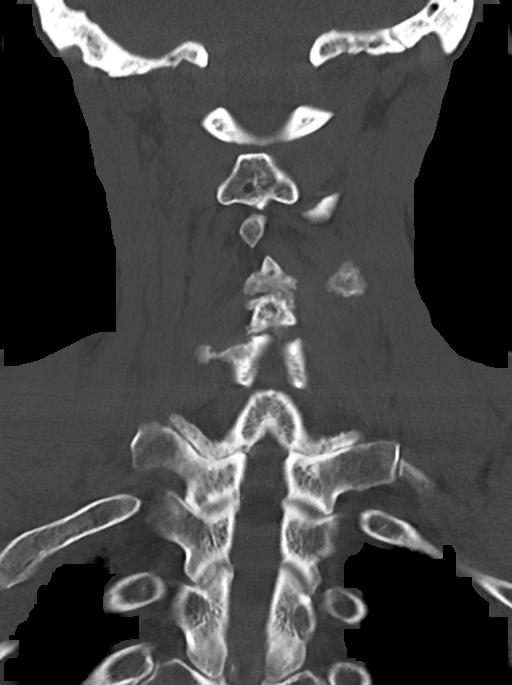

[Series 12: orthogonal axials · axial · 0.21mm/px · z∈[-277,-227]mm · 2 of 106 slices shown, 3 images]
[im 36/106  soft-tissue]
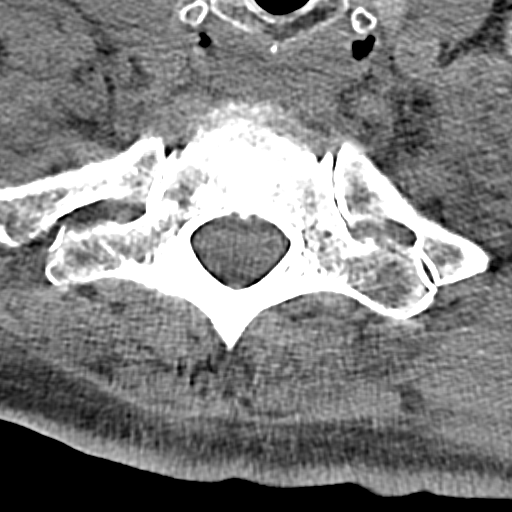
[im 36/106  bone]
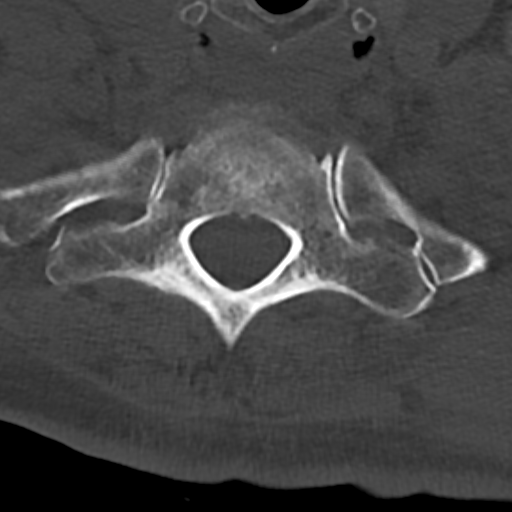
[im 71/106  bone]
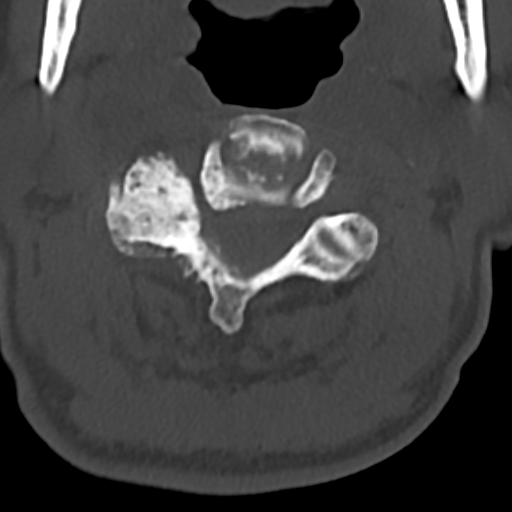

[11 of 33 positions shown; findings below may reference images not displayed]

FINDINGS: CT HEAD FINDINGS

Brain: No intracranial hemorrhage or CT evidence of large acute
infarct. Chronic microvascular changes. Global atrophy. No
intracranial mass lesion noted on this unenhanced exam.

Vascular: Vascular calcifications

Skull: No skull fracture

Sinuses/Orbits: Post lens replacement. No acute orbital abnormality.
Minimal mucosal thickening left maxillary sinus.

Other: Mastoid air cells and middle ear cavities are clear.

CT CERVICAL SPINE FINDINGS

Alignment: Mild curvature cervical spine. Slight retrolisthesis C6
and anterolisthesis C7 most likely secondary to facet degenerative
changes.

Skull base and vertebrae: No cervical spine fracture.

Soft tissues: No abnormal prevertebral soft tissue swelling.

Disc levels: Multilevel cervical spondylotic changes with various
degrees of spinal stenosis and foraminal narrowing. Spinal stenosis
most notable C3-4, C5-6 and C6-7.

Upper chest: No worrisome abnormality.

Other: No worrisome abnormality.
IMPRESSION: Head CT:

1. No skull fracture or intracranial hemorrhage.
2. Chronic microvascular changes and atrophy.

Cervical spine CT:

1. Mild curvature cervical spine. Slight retrolisthesis C6 and
anterolisthesis C7 most likely secondary to facet degenerative
changes.
2. No cervical spine fracture or abnormal prevertebral soft tissue
swelling noted.
3. Multilevel cervical spondylotic changes with various degrees of
spinal stenosis and foraminal narrowing. Spinal stenosis most
notable C3-4, C5-6 and C6-7.

## 2019-11-30 IMAGING — DX DG CHEST 1V PORT
1 series · 1 of 1 positions shown · non-contrast
Comparison: Portable chest x-ray January 27, 2018

CLINICAL DATA: Possible fall today. Known fall several weeks ago.
No current chest pain or shortness of breath. History of
hypertension, coronary artery disease, complete heart block.

EXAM:
PORTABLE CHEST 1 VIEW

[chest ap]
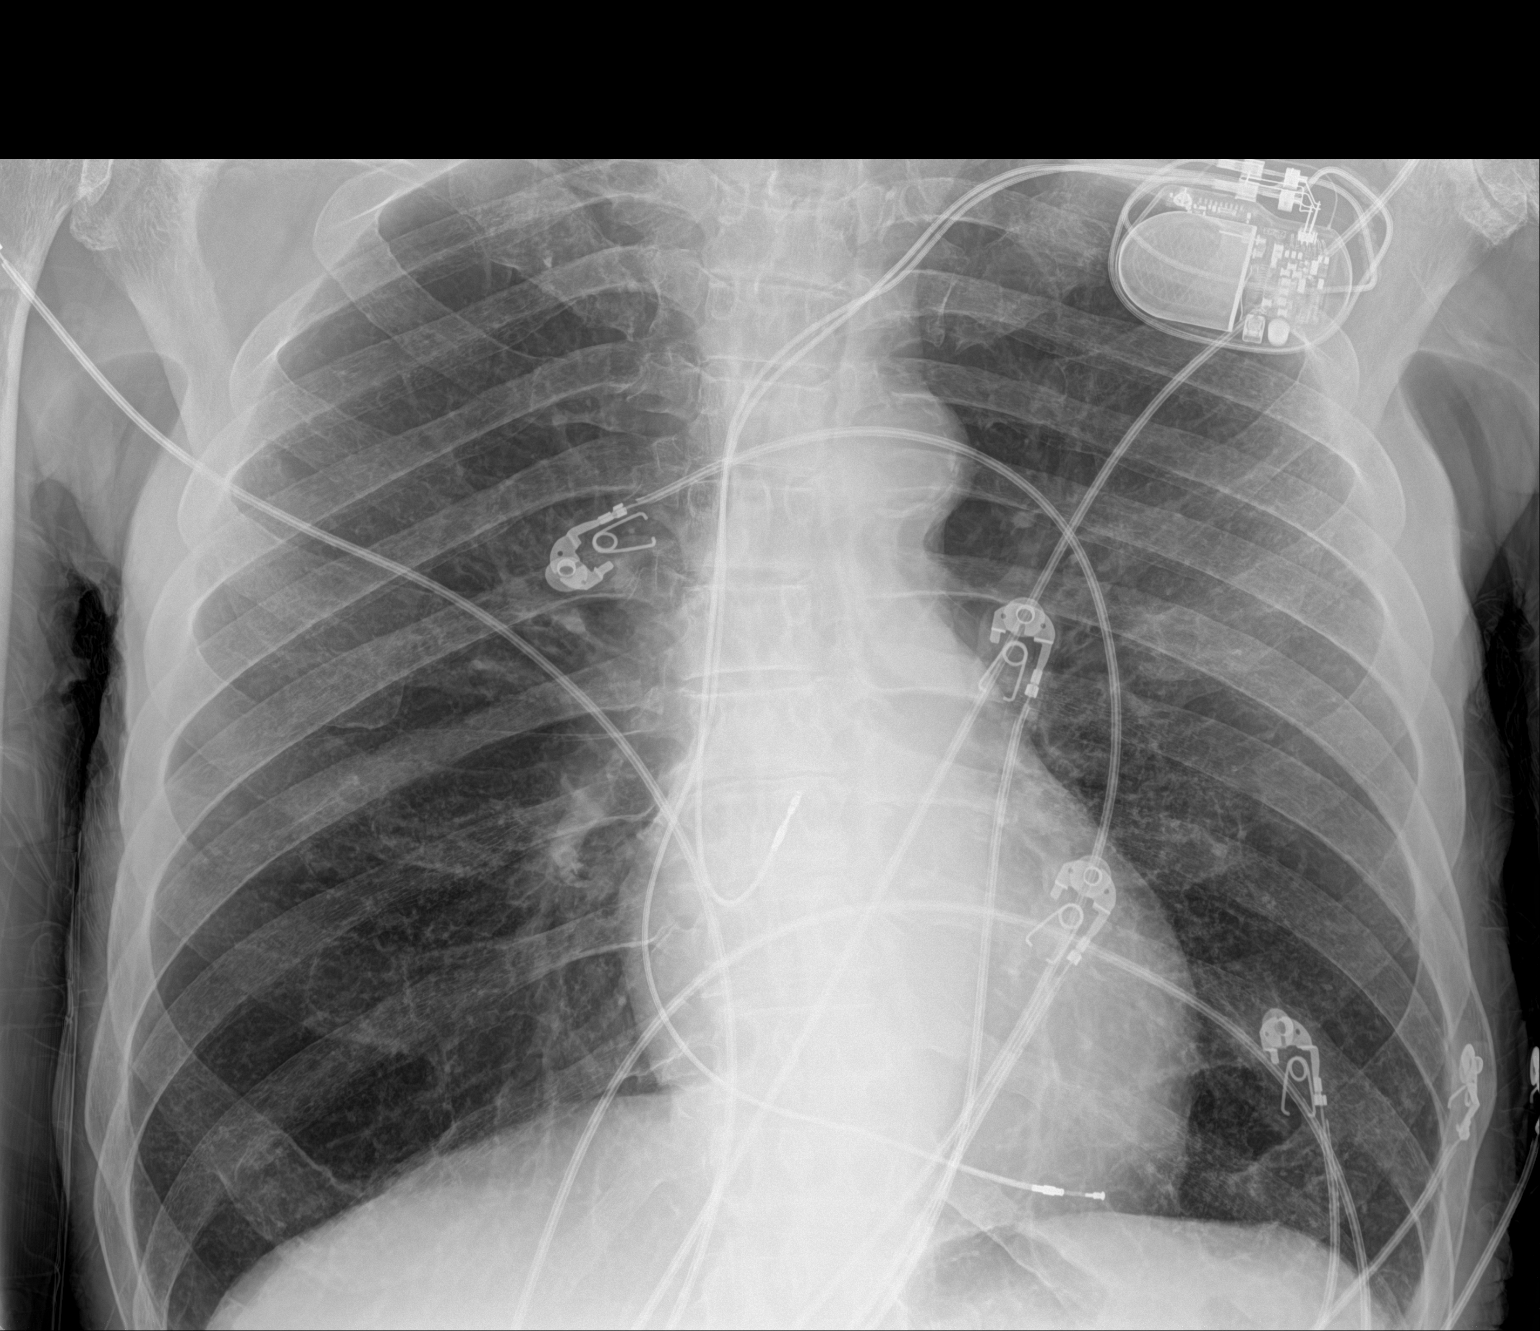

[1 of 1 positions shown; findings below may reference images not displayed]

FINDINGS: The lungs are mildly hyperinflated. There is no focal infiltrate,
pleural effusion, or pneumothorax. The heart and pulmonary
vascularity are normal. The mediastinum is normal in width. There is
calcification in the wall of the aortic arch. The ICD is in stable
position. The observed bony thorax exhibits no acute abnormality.
IMPRESSION: COPD. No pneumonia, CHF, nor other acute cardiopulmonary
abnormality. No evidence of acute post traumatic injury of the
thorax.
